# Patient Record
Sex: Male | Born: 1940 | Race: White | Hispanic: No | Marital: Married | Smoking: Former smoker
Health system: Southern US, Community
[De-identification: ages and names within clinical notes are randomized; demographics above are authoritative.]

## PROBLEM LIST (undated history)

## (undated) DIAGNOSIS — H3530 Unspecified macular degeneration: Secondary | ICD-10-CM

## (undated) DIAGNOSIS — I8390 Asymptomatic varicose veins of unspecified lower extremity: Secondary | ICD-10-CM

## (undated) HISTORY — PX: ENDOVENOUS ABLATION SAPHENOUS VEIN W/ LASER: SUR449

## (undated) HISTORY — PX: CHOLECYSTECTOMY: SHX55

## (undated) HISTORY — DX: Asymptomatic varicose veins of unspecified lower extremity: I83.90

## (undated) HISTORY — DX: Unspecified macular degeneration: H35.30

## (undated) HISTORY — PX: HEMORRHOID SURGERY: SHX153

---

## 2001-03-19 ENCOUNTER — Ambulatory Visit (HOSPITAL_COMMUNITY): Admission: RE | Admit: 2001-03-19 | Discharge: 2001-03-19 | Payer: Self-pay | Admitting: Internal Medicine

## 2001-03-19 ENCOUNTER — Encounter: Payer: Self-pay | Admitting: Internal Medicine

## 2002-02-18 ENCOUNTER — Ambulatory Visit (HOSPITAL_COMMUNITY): Admission: RE | Admit: 2002-02-18 | Discharge: 2002-02-18 | Payer: Self-pay | Admitting: Internal Medicine

## 2002-03-20 ENCOUNTER — Encounter: Payer: Self-pay | Admitting: Internal Medicine

## 2002-03-20 ENCOUNTER — Ambulatory Visit (HOSPITAL_COMMUNITY): Admission: RE | Admit: 2002-03-20 | Discharge: 2002-03-20 | Payer: Self-pay | Admitting: Internal Medicine

## 2003-03-31 ENCOUNTER — Ambulatory Visit (HOSPITAL_COMMUNITY): Admission: RE | Admit: 2003-03-31 | Discharge: 2003-03-31 | Payer: Self-pay | Admitting: Internal Medicine

## 2003-10-05 ENCOUNTER — Ambulatory Visit (HOSPITAL_COMMUNITY): Admission: RE | Admit: 2003-10-05 | Discharge: 2003-10-05 | Payer: Self-pay | Admitting: Urology

## 2003-10-22 ENCOUNTER — Ambulatory Visit (HOSPITAL_COMMUNITY): Admission: RE | Admit: 2003-10-22 | Discharge: 2003-10-22 | Payer: Self-pay | Admitting: Internal Medicine

## 2003-11-09 ENCOUNTER — Ambulatory Visit (HOSPITAL_COMMUNITY): Admission: RE | Admit: 2003-11-09 | Discharge: 2003-11-10 | Payer: Self-pay | Admitting: Neurosurgery

## 2004-03-22 ENCOUNTER — Ambulatory Visit (HOSPITAL_COMMUNITY): Admission: RE | Admit: 2004-03-22 | Discharge: 2004-03-22 | Payer: Self-pay | Admitting: Internal Medicine

## 2004-03-25 ENCOUNTER — Ambulatory Visit (HOSPITAL_COMMUNITY): Admission: RE | Admit: 2004-03-25 | Discharge: 2004-03-25 | Payer: Self-pay | Admitting: Internal Medicine

## 2005-03-27 ENCOUNTER — Ambulatory Visit (HOSPITAL_COMMUNITY): Admission: RE | Admit: 2005-03-27 | Discharge: 2005-03-27 | Payer: Self-pay | Admitting: Internal Medicine

## 2006-04-05 ENCOUNTER — Ambulatory Visit (HOSPITAL_COMMUNITY): Admission: RE | Admit: 2006-04-05 | Discharge: 2006-04-05 | Payer: Self-pay | Admitting: Internal Medicine

## 2006-09-18 ENCOUNTER — Emergency Department (HOSPITAL_COMMUNITY): Admission: EM | Admit: 2006-09-18 | Discharge: 2006-09-18 | Payer: Self-pay | Admitting: Emergency Medicine

## 2007-04-09 ENCOUNTER — Ambulatory Visit (HOSPITAL_COMMUNITY): Admission: RE | Admit: 2007-04-09 | Discharge: 2007-04-09 | Payer: Self-pay | Admitting: Internal Medicine

## 2007-04-16 ENCOUNTER — Ambulatory Visit: Payer: Self-pay | Admitting: Vascular Surgery

## 2007-07-16 ENCOUNTER — Ambulatory Visit: Payer: Self-pay | Admitting: Vascular Surgery

## 2007-08-12 ENCOUNTER — Ambulatory Visit: Payer: Self-pay | Admitting: Vascular Surgery

## 2007-08-20 ENCOUNTER — Ambulatory Visit: Payer: Self-pay | Admitting: Vascular Surgery

## 2007-10-14 ENCOUNTER — Ambulatory Visit: Payer: Self-pay | Admitting: Vascular Surgery

## 2007-10-22 ENCOUNTER — Ambulatory Visit: Payer: Self-pay | Admitting: Vascular Surgery

## 2008-05-14 ENCOUNTER — Encounter (INDEPENDENT_AMBULATORY_CARE_PROVIDER_SITE_OTHER): Payer: Self-pay | Admitting: *Deleted

## 2008-06-04 DIAGNOSIS — K644 Residual hemorrhoidal skin tags: Secondary | ICD-10-CM | POA: Insufficient documentation

## 2008-06-04 DIAGNOSIS — Z8719 Personal history of other diseases of the digestive system: Secondary | ICD-10-CM | POA: Insufficient documentation

## 2008-06-05 ENCOUNTER — Ambulatory Visit: Payer: Self-pay | Admitting: Internal Medicine

## 2008-06-05 DIAGNOSIS — K5730 Diverticulosis of large intestine without perforation or abscess without bleeding: Secondary | ICD-10-CM | POA: Insufficient documentation

## 2008-06-09 ENCOUNTER — Telehealth (INDEPENDENT_AMBULATORY_CARE_PROVIDER_SITE_OTHER): Payer: Self-pay

## 2008-06-19 ENCOUNTER — Ambulatory Visit (HOSPITAL_COMMUNITY): Admission: RE | Admit: 2008-06-19 | Discharge: 2008-06-19 | Payer: Self-pay | Admitting: Internal Medicine

## 2008-06-19 ENCOUNTER — Ambulatory Visit: Payer: Self-pay | Admitting: Internal Medicine

## 2008-06-22 ENCOUNTER — Encounter: Payer: Self-pay | Admitting: Internal Medicine

## 2008-08-25 ENCOUNTER — Ambulatory Visit: Payer: Self-pay | Admitting: Vascular Surgery

## 2009-01-11 ENCOUNTER — Ambulatory Visit: Payer: Self-pay | Admitting: Vascular Surgery

## 2009-01-25 ENCOUNTER — Ambulatory Visit: Payer: Self-pay | Admitting: Vascular Surgery

## 2009-02-02 ENCOUNTER — Ambulatory Visit: Payer: Self-pay | Admitting: Vascular Surgery

## 2009-09-14 ENCOUNTER — Ambulatory Visit: Payer: Self-pay | Admitting: Vascular Surgery

## 2009-11-04 ENCOUNTER — Ambulatory Visit: Payer: Self-pay | Admitting: Otolaryngology

## 2009-11-18 ENCOUNTER — Ambulatory Visit: Payer: Self-pay | Admitting: Otolaryngology

## 2010-07-12 NOTE — Procedures (Signed)
LOWER EXTREMITY VENOUS REFLUX EXAM   INDICATION:  Pain/swelling of left leg.   EXAM:  Using color-flow imaging and pulse Doppler spectral analysis, the  left common femoral, superficial femoral, popliteal, posterior tibial,  greater and lesser saphenous veins are evaluated.  There is no evidence  suggesting deep venous insufficiency in the left lower extremity.   The left saphenofemoral junction is competent. The left GSV is not  competent with reflux of >532milliseconds with the caliber as described  below.  The left GSV appears thrombosed from mid thigh to knee level.   The left proximal short saphenous vein demonstrates incompetency.  The  left lesser saphenous vein appears thrombosed mid calf to distal calf.   GSV Diameter (used if found to be incompetent only)                                            Right    Left  Proximal Greater Saphenous Vein           cm       0.42 cm  Proximal-to-mid-thigh                     cm       0.41 cm  Mid thigh                                 cm       0.36 cm  Mid-distal thigh                          cm       cm  Distal thigh                              cm       0.36 cm  Knee                                      cm       0.28 cm   IMPRESSION:  1. 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  2. The left greater saphenous vein is not aneurysmal.  3. The left greater saphenous vein is not tortuous.  4. The deep venous system is competent.  5. The left lesser saphenous vein is not competent with reflux of      >530milliseconds.      The left LSV appears thrombosed mid calf to distal calf.      ___________________________________________  Quita Skye. Hart Rochester, M.D.   CB/MEDQ  D:  09/14/2009  T:  09/14/2009  Job:  [id number]

## 2010-07-12 NOTE — Assessment & Plan Note (Signed)
OFFICE VISIT   Steve Hawkins, Steve Hawkins  DOB:  January 09, 1941                                       09/14/2009  ZOXWR#:[id number]   The patient returns today for a recent problem he had associated with  his left leg.  He has previously undergone laser ablation of the left  great saphenous vein in August of 2009 and left laser ablation of the  left small saphenous vein in November of 2010, both associated with stab  phlebectomies.  Following his great saphenous procedure a duplex  revealed that he did have a bifid parallel system and the more lateral  branch of the great saphenous vein was totally thrombosed but there was  a medial branch which did not have severe reflux at that time.  On July  4 he developed a swollen area on the medial aspect of his distal thigh  on the left with some bluish discoloration and tenderness and over the  last week or so that has resolved without treatment.  He has had no  change in his distal edema or other areas.   On examination today he has a small area of reticular veins in the  medial aspect of the left lower thigh.  There are  no bulging  varicosities.  On the anterior aspect there are a few small varicosities  in the mid calf posteriorly.  He has palpable pulses distally with no  distal edema.  Venous duplex exam was ordered by me today which was  reviewed and interpreted.  He does have a deep system which is normal  with no DVT.  The lateral branch of the great saphenous vein is ablated.  The more medial branch does have some flow and a few areas with some  mild reflux.   I do not think any treatment is necessary at this time.  If he should  develop increasing bulging varicosities or other thrombosed varicosities  with thrombophlebitis he should return and we will repeat a venous  duplex exam and it could be that he will require closure of the more  medial branch of his great saphenous vein in the future if this worsens.  Blood  pressure 130/80, heart rate 55, respirations 20.  He will return  to see [place] on a p.r.n. basis.     Quita Skye Hart Rochester, M.D.  Electronically Signed   JDL/MEDQ  D:  09/14/2009  T:  09/15/2009  Job:  [id number]

## 2010-07-12 NOTE — Assessment & Plan Note (Signed)
OFFICE VISIT   Steve Hawkins, Steve Hawkins  DOB:  November 22, 1940                                       02/02/2009  ZOXWR#:[id number]   The patient returns 1 week post laser ablation of the left small  saphenous vein with multiple stab phlebectomies for painful  varicosities.  He states that the edema in his left ankle and foot has  already diminished significantly and his left leg already feels better.  He has some mild aching over the course of the small saphenous vein  where the laser ablation was performed.   Venous duplex exam today reveals no evidence of deep venous obstruction  with closure of the left small saphenous vein from the saphenopopliteal  junction to the distal calf.   He was reassured regarding these findings.  Blood pressure today is  143/81, heart rate 61, respirations 20.  He will wear elastic  compression stockings for 1 more week and then on a p.r.n. basis and  return to see [place] as necessary.   Quita Skye Hart Rochester, M.D.  Electronically Signed   JDL/MEDQ  D:  02/02/2009  T:  02/03/2009  Job:  [id number]

## 2010-07-12 NOTE — Consult Note (Signed)
VASCULAR SURGERY CONSULTATION   NASHUA, HOMEWOOD  DOB:  [birth year]                                       04/16/2007  ZOXWR#:[id number]   Patient is a 70 year old healthy male patient with a 10-year history of  progressive venous insufficiency on both lower extremities.  He has been  having progressive edema in the lower calf and ankle as the day  progresses, which is associated with aching, throbbing, and sometimes  burning discomfort in the calf and ankle area bilaterally.  As the  swelling progresses, his symptoms worsen.  He has no history of deep  venous thrombosis, thrombophlebitis, or pulmonary emboli, but states  that he has had some episodes where the veins have become red and  tender.  He has had no bleeding, ulceration, or other complications.  He states that these symptoms are effecting his daily living and are  fairly similar between the right and left leg.  He has worn short leg  elastic compression stockings with some mild help, although these were  not graduated compression stocking.  He does not elevate his legs on a  frequent basis nor take pain medications.   PAST MEDICAL HISTORY:  Negative for diabetes, hypertension, coronary  artery disease, COPD, hyperlipidemia, or stroke.   PAST SURGICAL HISTORY:  1. Cholecystectomy.  2. Hemorrhoidectomy.  3. Lumbar disk surgery.   FAMILY HISTORY:  Negative for coronary artery disease, diabetes, and  stroke.  Positive for severe varicose veins in his father.   SOCIAL HISTORY:  He is married, has three children, is retired.  He has  not smoked since 2001.  Drinks one alcoholic beverage a day.   REVIEW OF SYSTEMS:  Unremarkable.   MEDICATIONS:  Include Avodart 1 daily.   ALLERGIES:  None known.   PHYSICAL EXAMINATION:  Blood pressure 118/72, heart rate 80,  respirations 14.  Generally, he is a healthy-appearing middle-aged male  in no apparent distress.  He is alert and oriented x3.  Neck is supple  with 3+ carotid pulses palpable.  No bruits are audible.  Neurologic  exam is normal.  No palpable adenopathy in the neck.  No skin rashes are  noted.  Upper extremity pulses are 3+ bilaterally.  Chest:  Clear to  auscultation.  Cardiovascular:  Regular rhythm with no murmurs.  Abdomen  is soft and nontender with no palpable masses.  Extremity exam reveals  3+ femoral, popliteal, and 2+ dorsalis pedis pulses palpable  bilaterally.  Both legs have significant venous insufficiency.  On the  left leg, there are large varicosities in the distal medial thigh and  medial calf region and a large nest of posterior calf varicosities.  There are prominent spider and reticular veins near the ankle with some  early hyperpigmentation.  On the right side, there are greater saphenous  varicosities beginning at the knee and extending into the calf region  medially and posteriorly in the calf.  There are also some dilated veins  around the ankle with some early hyperpigmentation but no ulceration.   Duplex scanning was performed in the office today, which reveals three  significant findings:  He has reflux in both greater saphenous veins  throughout the thigh and also has reflux in the left small saphenous  vein.  There is no evidence of deep venous thrombosis or deep venous  problems.  I think we should treat him with elastic compression stockings,  analgesics, elevation, and conservative measures to see if he gets  improvement in his symptoms.  If not, he would benefit from staged  bilateral greater saphenous laser ablations with stab phlebectomies, to  be followed by the left small saphenous vein, treated in a similar  fashion.  Patient will return in three months for followup.   Quita Skye Hart Rochester, M.D.  Electronically Signed  JDL/MEDQ  D:  04/16/2007  T:  04/17/2007  Job:  [id number]   cc:   Kingsley Callander. Ouida Sills, MD

## 2010-07-12 NOTE — Op Note (Signed)
NAME:  Steve Hawkins, Steve Hawkins NO.:  [id number]   MEDICAL RECORD NO.:  [id number]          PATIENT TYPE:  AMB   LOCATION:  DAY                           FACILITY:  [facility]   PHYSICIAN:  R. Roetta Sessions, M.D. DATE OF BIRTH:  1940/05/01   DATE OF PROCEDURE:  06/19/2008  DATE OF DISCHARGE:                               OPERATIVE REPORT   PROCEDURE:  Diagnostic colonoscopy.   INDICATIONS FOR PROCEDURE:  A 70 year old gentleman with intermittent  blood per rectum in a setting of otherwise normal bowel function.  He  has 1-2 bowel movements daily without straining.  Denies constipation,  diarrhea.  Has not had any melena.  Last colonoscopy was 10 years ago.  No family history of colon cancer in any first degree relatives.  Colonoscopy is now being done.  Risks, benefits, alternatives and  limitations have been reviewed.  Questions answered.  Please see the  documentation in the medical record.   PROCEDURE:  O2 saturation, blood pressure, pulse, respirations were  monitored throughout the entire procedure.  Conscious sedation Versed 4  mg IV, Demerol 75 mg IV in divided doses.  Instrument Pentax video chip  system.  Digital exam revealed prominent circumferential prolapsed hemorrhoidal  tags.  On digital exam there are no rectal masses.  However, one of  these tags was excoriated and readily bled with digital manipulation.  Please see detailed photograph that was taken on the Pentax system.   ENDOSCOPIC FINDINGS:  The prep was good.  Examination of the rectal  mucosa revealed retroflexed view of the anal verge which demonstrated no  abnormalities.   COLON:  Colonic mucosa was surveyed from the rectosigmoid junction  through the left transverse, right colon, appendiceal orifice, ileocecal  valve and cecum.  These structures were well seen and photographed for  the record.  From this level the scope was slowly and cautiously  withdrawn.  All previous mentioned mucosal surfaces  were again seen.  The patient had scattered pancolonic diverticula.  The remainder of the  colonic mucosa appeared normal.  The scope was pulled back down in the  rectum and rectal mucosa was once again examined including retroflexion  without additional findings.  The patient tolerated the procedure well  and was reactive after endoscopy.  Cecal withdrawal time 10 minutes.   IMPRESSION:  1. Multiple circumferential external hemorrhoid tags, one excoriated      and was friable and easily bled.  2. Normal rectum with few scattered pancolonic diverticulum.      Remainder of colonic mucosa appeared normal.   RECOMMENDATIONS:  1. I feel that [name] needs to go ahead and have definitive      surgical management of bleeding external hemorrhoids, conservative      measures unlikely to make any difference over the short or long      haul in this setting.  I have recommended [name] see a surgeon      for definitive management.  Diverticulosis literature provided      to [name].  He does take Benefiber one tablespoon daily and I  have recommended he continue that approach.  2. Consider repeat screening colonoscopy 10 years.      [name], M.D.  Electronically Signed     RMR/MEDQ  D:  06/19/2008  T:  06/19/2008  Job:  [id number]   cc:   Kingsley Callander. Ouida Sills, MD  Fax: [phone]

## 2010-07-12 NOTE — Assessment & Plan Note (Signed)
OFFICE VISIT   Steve Hawkins, Steve Hawkins  DOB:  08-06-1940                                       10/22/2007  ZOXWR#:[id number]   The patient returns today 1 week post laser ablation of left great  saphenous vein with multiple stab phlebectomies for painful  varicosities.  He has done well from a clinical standpoint with no  unusual discomfort in the left thigh compared to the contralateral right  side which was done previously.  He has had some left posterior calf  discomfort.  There has been no change in his distal edema.   EXAM:  Today blood pressure 118/70, heart rate 70, respirations 14.  Left leg has mild tenderness along the course of the great saphenous  vein with no swelling.  He also has a palpable cord along the course of  the small saphenous vein in the posterior calf.  There is no distal  edema.  The stab phlebectomy wounds are all healing satisfactorily.   Venous duplex exam reveals total occlusion of the primary trunk of the  left great saphenous vein, although there is a parallel system and the  medial most trunk is patent but has minimal flow and no reflux.  Great  saphenous vein communicates with the lesser saphenous vein at the level  of the knee and interestingly, the left lesser saphenous vein is totally  occluded now having had gross reflux on our previous study.  There is no  deep venous thrombosis either at the popliteal level or at the common  femoral level, the deep system being widely patent.   I think he thrombosed the left small saphenous vein secondary to laser  ablation procedure because of the communication between the small and  great saphenous systems.  The vein of Giacomini.  I recommended that he  take 1 aspirin per day indefinitely as well as continue to take  ibuprofen for 2 more weeks for anti-inflammatories effect.  He will  continue to wear his elastic compression stockings for the next few  weeks or on any plane trips  within the next few months.  If he has any  change in the distal edema, he will be in touch with me.  Otherwise we  will see him on a p.r.n. basis.   Quita Skye Hart Rochester, M.D.  Electronically Signed   JDL/MEDQ  D:  10/22/2007  T:  10/23/2007  Job:  [id number]

## 2010-07-12 NOTE — Assessment & Plan Note (Signed)
OFFICE VISIT   CHANCELLOR, VANDERLOOP  DOB:  November 16, 1940                                       08/20/2007  JYNWG#:[id number]   The patient underwent laser ablation of right greater saphenous vein  with multiple stab phlebectomies 1 week ago for severe venous  insufficiency of right leg.  He has had minimal discomfort and swelling  and is wearing his elastic compression stocking as requested.  He did  take the ibuprofen for 2 days.  He has had some mild bruising in the  stab phlebectomy sites between the knee and the ankle but this seems to  be resolving he states.  Venous duplex exam was performed in the  vascular lab today which reveals total occlusion of the right great  saphenous vein from the saphenofemoral junction to the knee with no  evidence of deep venous thrombosis.  We will proceed with laser ablation  of his left great saphenous vein with multiple stab phlebectomies on  August 17.   James D. Hart Rochester, M.D.  Electronically Signed   JDL/MEDQ  D:  08/20/2007  T:  08/21/2007  Job:  [id number]

## 2010-07-12 NOTE — Assessment & Plan Note (Signed)
OFFICE VISIT   Steve Hawkins, Steve Hawkins  DOB:  Jul 13, 1940                                       08/25/2008  JXBJY#:[id number]   The patient returns today status post laser ablation of left great  saphenous vein with multiple stab phlebectomies in August of 2009 for  painful varicosities in the left thigh and calf.  He also had gross  reflux in the left small saphenous vein which had been approved for  laser ablation and multiple stab phlebectomies.  He returned after his  initial visit following the greater saphenous ablation.  It appeared  that there was some possible acute closure of the small saphenous vein  in areas although it was still dilated.  We elected not to perform that  at that time but to follow him.  He has done well for several months but  over the last 3-4 months has been having some new varicosities in the  distal thigh and calf and continues to have aching throbbing discomfort  in the left calf.  He has not had any stasis ulcers or bleeding but has  had persistent edema in the left ankle compared to the right side.  He  states that the discomfort has returned and he is having aching,  throbbing and burning discomfort in the calf and distal thigh.   PHYSICAL EXAMINATION:  On exam today his blood pressure is 118/78, heart  rate is 56, respirations 14.  His left leg has new varicosities in the  distal thigh over the course of the distal great saphenous vein and he  continues to have bulging varicosities in the left posterior calf in the  distribution of the small saphenous vein.  He has 1-2+ edema at the  ankle.   Venous duplex exam was repeated today.  He has a normal deep system on  the left side but has a large small saphenous vein with gross reflux  which communicates with the great saphenous vein in the calf.   I feel most likely that the gross reflux in the large small saphenous  vein is causing his persistent ankle edema and painful  varicosities in  the left calf as well as these new varicosities in the distal greater  saphenous system anteriorly because of the communication.  We will once  again put him in long leg elastic compression stockings (20 mm - 30 mm  gradient) as well as try elevation and analgesics (ibuprofen daily) to  see if this will relieve his symptoms.  He will return in 3-4 months for  further followup to see how his symptoms are progressing.  If this is  not resolved I think he would benefit from laser ablation of the left  small saphenous vein with multiple stab phlebectomies to relieve his  painful varicosities and also to relieve the edema in the left ankle.   Quita Skye Hart Rochester, M.D.  Electronically Signed   JDL/MEDQ  D:  08/25/2008  T:  08/26/2008  Job:  [id number]

## 2010-07-12 NOTE — Procedures (Signed)
DUPLEX DEEP VENOUS EXAM - LOWER EXTREMITY   INDICATION:  Follow-up evaluation of right lower extremity, status post  GSV ablation.   HISTORY:  Edema:  No.  Trauma/Surgery:  Right GSV ablation and sclerotherapy.  Pain:  No.  PE:  No.  Previous DVT:  No.  Anticoagulants:  No.  Other:   DUPLEX EXAM:                CFV   SFV   PopV  PTV    GSV                R  L  R  L  R  L  R   L  R  L  Thrombosis    o     o     o     o      +  Spontaneous   +     +     +     +      0  Phasic        +     +     +     +      0  Augmentation  +     +     +     +      0  Compressible  +     +     +     +      0  Competent   Legend:  + - yes  o - no  p - partial  D - decreased   IMPRESSION:  1. No evidence of deep venous thrombosis in right lower extremity.  2. Right greater saphenous vein appears occluded from the      saphenofemoral junction distally, status post ablation.    _____________________________  Quita Skye Hart Rochester, M.D.   PB/MEDQ  D:  08/20/2007  T:  08/20/2007  Job:  [id number]

## 2010-07-12 NOTE — Assessment & Plan Note (Signed)
OFFICE VISIT   MEDFORD, STAHELI  DOB:  1940-06-01                                       07/16/2007  EAVWU#:[id number]   The patient returns today for further evaluation of his severe venous  insufficiency of both lower extremities.  He continues to have aching  and throbbing discomfort in both legs, thighs and calf, with the right  being worse than the left.  He has been wearing elastic compression  stockings for the last 3 months with no improvement of his symptoms.  He  has also tried analgesics on a regular basis and elevation of the legs  with no relief of his symptomatology.  He does have more swelling in the  right ankle toward the end of the day than the left side, but both legs  are symptomatic and affecting his daily living.  He does have gross  incompetence of both greater saphenous systems as well as the left small  saphenous vein.  His varicosities are in the right medial calf and the  left medial thigh and calf and posterior calf.  I think he would be best  treated with laser ablation of the right great saphenous vein with some  calf stab phlebectomies simultaneously as his first procedure to be  followed by laser ablation of the left great saphenous vein with stab  phlebectomies, to be followed by laser ablation of the left small  saphenous vein with stab phlebectomies.  We will proceed with  precertification for these procedures to be done in the near future.  I  did visualize his saphenous veins today with the duplex scanner.  He  also has some early skin changes in both ankles on physical exam, mild  edema on the right side, and palpable dorsalis pedis and posterior  tibial pulses bilaterally.   Quita Skye Hart Rochester, M.D.  Electronically Signed   JDL/MEDQ  D:  07/16/2007  T:  07/17/2007  Job:  [id number]

## 2010-07-12 NOTE — Assessment & Plan Note (Signed)
OFFICE VISIT   SEDERICK, JACOBSEN  DOB:  1940/03/24                                       01/11/2009  OZHYQ#:[id number]   The patient returns today for further examination of his painful  varicosities in the left calf which recurred.  He had successful  ablation of the left great saphenous vein in August 2009 and at that  time was noted to have reflux in the small saphenous vein, but that  thrombosed spontaneously.  Unfortunately that recanalized and he has  developed severe painful varicosities in the left posterior calf  associated with his small saphenous reflux.  This has not been relieved  by long-leg elastic compression stockings for the past 3 months.  He has  also tried elevation and analgesics (ibuprofen).  He has aching, burning  and throbbing discomfort as well as swelling in the left ankle as the  day progresses.   He denies any chest pain, dyspnea on exertion, PND, orthopnea, chronic  bronchitis, COPD and no abdominal symptoms.  All other systems are  negative.   SOCIAL HISTORY:  He is married, has three children, is retired and does  not use tobacco since 2004.   PHYSICAL EXAM:  Blood pressure 120/70, heart rate 70, respirations 14,  carotid pulses 3+, no audible bruits.  Neurologic:  Normal.  No palpable  adenopathy in the neck.  Chest:  Clear to auscultation.  Cardiovascular:  Regular rhythm, no murmurs.  Lower extremity exam:  Reveals no skin  rashes.  He has bulging varicosities in left posterior calf extending  down to the ankle with 1+ edema in the left ankle compared to the right.   He has no evidence of deep venous obstruction and does have palpable  arterial pulses in his left foot and gross reflux in the left small  saphenous vein.  I think we should proceed with laser ablation of left  small saphenous vein with multiple stab phlebectomies to relieve his  symptoms.  We will proceed with precertification to perform this in the  near future.   Quita Skye Hart Rochester, M.D.  Electronically Signed   JDL/MEDQ  D:  01/11/2009  T:  01/12/2009  Job:  [id number]

## 2010-07-12 NOTE — Procedures (Signed)
DUPLEX DEEP VENOUS EXAM - LOWER EXTREMITY   INDICATION:  Followup, left greater saphenous vein ablation.   HISTORY:  Edema:  No.  Trauma/Surgery:  Left greater saphenous vein ablation with stab  phlebectomy, 10/14/07, by Dr. Hart Rochester.  Right greater saphenous vein  ablation with stab phlebectomy on 08/12/07 by Dr. Hart Rochester.  Pain:  Posterior of left calf.  PE:  No.  Previous DVT:  No.  Anticoagulants:  No.  Other:   DUPLEX EXAM:                CFV   SFV   PopV   PTV   GSV                R  L  R  L  R  L   R  L  R  L  Thrombosis    o  o     o     o      O     +  Spontaneous   +  +     +     +      +     0  Phasic        +  +     +     +      +     0  Augmentation  +  +     +     +      +     0  Compressible  +  +     +     +      +     0  Competent     +  D     +     +      +     0   Legend:  + - yes  o - no  p - partial  D - decreased   IMPRESSION:  1. No evidence of deep venous thrombosis in the left lower extremity      or right common femoral vein.  2. 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  3. Left short saphenous vein shows evidence of acute thrombus and      dilatation with a branch connecting to the greater saphenous vein.       _____________________________  Quita Skye. Hart Rochester, M.D.   AS/MEDQ  D:  10/22/2007  T:  10/22/2007  Job:  [id number]

## 2010-07-12 NOTE — Procedures (Signed)
LOWER EXTREMITY VENOUS REFLUX EXAM   INDICATION:  Left leg varicose vein with pain.   EXAM:  Using color-flow imaging and pulse Doppler spectral analysis, the  left common femoral, superficial femoral, popliteal, posterior tibial,  greater and lesser saphenous veins are evaluated.  There is no evidence  suggesting deep venous insufficiency in the left lower extremity.   The left saphenofemoral junction is competent.  The left GSV, below knee  level, is not competent with the caliber as described below.   The left proximal short saphenous vein demonstrates incompetency.   GSV Diameter (used if found to be incompetent only)                                            Right    Left  Proximal Greater Saphenous Vein           cm       cm  Proximal-to-mid-thigh                     cm       cm  Mid thigh                                 cm       cm  Mid-distal thigh                          cm       cm  Distal thigh                              cm       cm  Knee                                      cm       cm   IMPRESSION:  1. Left greater saphenous vein, below knee level, reflux is identified      with the caliber ranging from 0.45.  2. The left greater saphenous vein is not aneurysmal.  3. The left greater saphenous vein is not tortuous.  4. The deep venous system is competent.  5. The left lesser saphenous vein is not competent.  6. No evidence of deep venous thrombosis noted in the left leg.  7. Please see attached measurements for the lesser saphenous vein.       ___________________________________________  Quita Skye. Hart Rochester, M.D.   MG/MEDQ  D:  08/25/2008  T:  08/25/2008  Job:  [id number]

## 2010-07-12 NOTE — Procedures (Signed)
DUPLEX DEEP VENOUS EXAM - LOWER EXTREMITY   INDICATION:  Followup of a left small saphenous vein ablation.   HISTORY:  Edema:  Yes  Trauma/Surgery:  EVLA  Pain:  Yes  PE:  No  Previous DVT:  No  Anticoagulants:  No  Other:   DUPLEX EXAM:                CFV   SFV   PopV  PTV    GSV                R  L  R  L  R  L  R   L  R  L  Thrombosis    o  o     o     o      o     o  Spontaneous   +  +     +     +      +     +  Phasic        +  +     +     +      +     +  Augmentation  +  +     +     +      +     +  Compressible  +  +     +     +      +     +  Competent     +  +     +     +      +     +   Legend:  + - yes  o - no  p - partial  D - decreased   IMPRESSION:  No evidence of deep venous thrombosis identified.  The left  small saphenous vein is ablated from the saphenopopliteal junction to  the distal calf.    _____________________________  Quita Skye. Hart Rochester, M.D.   CJ/MEDQ  D:  02/02/2009  T:  02/02/2009  Job:  [id number]

## 2010-07-15 NOTE — Op Note (Signed)
NAME:  Steve Hawkins, Steve Hawkins NO.:  [id number]   MEDICAL RECORD NO.:  [id number]                   PATIENT TYPE:  OIB   LOCATION:  2890                                 FACILITY:  [facility]   PHYSICIAN:  Clydene Fake, M.D.               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   PROCEDURE:  Left L2-3 far lateral discectomy, microdissection with  microscope.   SURGEON:  Clydene Fake, M.D.   ASSISTANT:  Payton Doughty, M.D.   ANESTHESIA:  General endotracheal anesthesia.   ESTIMATED BLOOD LOSS:  Minimal.   BLOOD REPLACED:  None.   DRAINS:  None.   COMPLICATIONS:  None.   INDICATIONS FOR PROCEDURE:  The patient is a 70 year old gentleman who has  been having back and left leg and groin pain.  MRI was done and found a  large foraminal disc herniation left L2-3 compressing L2 root.  The patient  is brought in for decompression.   DESCRIPTION OF PROCEDURE:  The patient was brought to the operating room.  General anesthesia induced.  The patient was placed in a Wilson frame with  all pressure points padded.  The patient was prepped and draped in sterile  fashion.  Site of incision was injected with 19 mL 1% lidocaine with  epinephrine.  Needle was placed in the interspace. X-rays were obtained  showing we were pointing at the 2-3 interspace.  Incision was then made  centered over that needle just slightly lateral to the midline to the left.  Incision was taken down to the fascia.  Hemostasis obtained.  Bovie  positioned.  Fascia was incised.  Paraspinous muscle was split and we found  two facets placed markers around the center, taking x-ray showing this was 3-  4 level.  We extend the incision just 0.5 cm or so extended her  dissection  cephalad from the transverse process of L2 and 3 and put a self-retaining  retractor so we could see the facets and pars.  L2 and the __________  processes of both 2 and 3.  Marker was placed in this foramen.  Other x-rays  were obtained showing we were at the 2-3 interspace.  __________ was then  used to start removing lateral facet and pars completed with Kerrison  punches.  Microscope was brought in for microdissection at this point.  We  dissected carefully in the foramen and found some ligamentum flavum, removed  that, decompressing the foramen.  We followed the three pedicle, vertebral  body and then moved cephalad finding the disc space.  We dissected down to  the disc space giving hemostasis with bipolar cauterization and Gelfoam and  thrombin.  Large  disc bulge was seen.  At this point, no free fragments were  seen but the nerve root did seem to be tight which was cephalad and lateral  to where we were. Disc space was incised with #15 blade and discectomy  performed with pituitary rongeurs and curettes.  We started using a hook and  coming up into the nerve root to make sure it was decompressed.  There we  found further fragments of disc and we carefully removed them and some of  these were scarred in around the nerve and its veins.  We carefully  dissected this area out and we were able to remove the free fragments.  When  we were finished, we had good decompression of the two roots through its  course from the foramen out laterally.  Hemostasis obtained with Gelfoam and  Thrombin.  Wound was irrigated with antibiotic solution.  Depo-Medrol was  placed over the two roots and retractor was removed.  Fascia closed with 0  Vicryl interrupted suture.  The subcutaneous tissue was closed with 0, 2-0  and 3-0 Vicryl interrupted sutures and the skin closed with Benzoin and  Steri-Strips. Dressing was placed.  The patient was placed back in supine  position, awakened from  anesthesia and returned to the recovery room in  stable condition.                                               Clydene Fake, M.D.    JRH/MEDQ  D:  11/09/2003  T:  11/09/2003  Job:  [id number]

## 2010-07-15 NOTE — Op Note (Signed)
NAME:  Steve Hawkins, Steve Hawkins NO.:  [id number]   MEDICAL RECORD NO.:  [id number]                   PATIENT TYPE:  AMB   LOCATION:  DAY                                  FACILITY:  [facility]   PHYSICIAN:  R. Roetta Sessions, M.D.              DATE OF BIRTH:  07-Nov-1940   DATE OF PROCEDURE:  02/18/2002  DATE OF DISCHARGE:                                 OPERATIVE REPORT   PROCEDURE:  Sigmoidoscopy with biopsy.   INDICATION FOR PROCEDURE:  The patient is a 70 year old gentleman who has  had small-volume rectal bleeding chronically for years but over this past  weekend had a much larger amount of painless fresh blood and clot per  rectum.  He tells me that he noted the seat of his pants was wet and went to  the bathroom and noted gross blood and clot in his underwear, even staining  his pants.  He has had multiple episodes.  He has not had any change in his  bowel habits.  He tells me he has one formed bowel movement daily.  No  abdominal pain, no lightheadedness.  He contacted me that evening and made  plans to evaluate him further today.   He tells me the larger-volume clots and blood per rectum have ceased, but he  continues to pass a small amount per rectum and this is not associated with  BMs.   The patient again gives a history of rectal bleeding.  He underwent a  sigmoidoscopy for hematochezia in 2000, was found to have some small polyps.  He subsequently underwent colonoscopy.  He had some inflammatory polyps that  were cold biopsied and coagulated from the left colon.  Nothing else was  found apparently.  Back in 1995 he was found to have some chronic colitis  on biopsies at colonoscopy.  He tells me has colitis symptoms from time to  time, passing mucous-like BMS, and a family history of colorectal neoplasia.   Sigmoidoscopy is now being done to further evaluate his symptoms.  He told  me that had hemorrhoid surgery some 35 years ago but has continued to  have  some, again, small-volume rectal bleeding ever since.  Sigmoidoscopy is now  being done to further evaluate his symptoms.  The approach has been  discussed with the patient.  The potential risks, benefits, and alternatives  have been reviewed.  ASA-1.   DESCRIPTION OF PROCEDURE:  The patient was placed in the left lateral  decubitus position.  O2 saturation, blood pressure, pulse, and respirations  were monitored throughout the entire procedure.  No conscious sedation was  given.   Instrument:  Olympus video chip adult colonoscope.   FINDINGS:  Inspection of the anus revealed four external hemorrhoidal tags.  They were soft.  They were not thrombosed and nontender.  Digital exam  revealed no abnormalities.   Endoscopic findings:  The prep was good.  Rectum:  Examination of the rectal mucosa, including retroflexed view of the  anal verge, again revealed the external/anal canal hemorrhoids.  The  vascular pattern of the rectal mucosa was preserved.  There were no erosions  or ulcerations.  There was slight granularity of the mucosa with a couple of  excoriated areas most consistent with enema tip trauma.   Colon:  The colonic mucosa was surveyed, surveyed to 40 cm.  The scope was  advanced to 40 cm in 1:1 fashion.  There were a couple of 3 mm polyps and  some scattered diverticula, and the colonic mucosa otherwise appeared normal  to 40 cm.  From this level the scope was slowly withdrawn.  All previously-  mentioned mucosal surfaces were again seen.  The two small polyps were cold  biopsied.  I also elected to take two biopsies of the rectal mucosa.   The patient tolerated the procedure well and was discharged.   IMPRESSION:  1. Multiple external hemorrhoids.  2. Slight granularity of the rectal mucosa of doubtful clinical     significance.  3. Scattered left-sided diverticula, two polyps in the sigmoid colon, cold     biopsied; the remainder of colonic mucosa to 40 cm  appeared normal.   I suspect the patient has bled from hemorrhoids.   RECOMMENDATIONS:  1. Hemorrhoid and diverticulosis literature given to the patient.  2. Specific therapy for hemorrhoids as follows:  Sitz baths 20 minutes every     other day, increase fiber in the diet, recommended a tablespoon of     Benefiber each morning with breakfast.  3. Canasa 500 mg mesalamine suppositories b.i.d. x3 weeks.  4. Follow up on pathology.  5. Further recommendations to follow.                                                 [name], M.D.    RMR/MEDQ  D:  02/18/2002  T:  02/18/2002  Job:  [id number]

## 2010-07-15 NOTE — Procedures (Signed)
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   EXAM:  Using color-flow imaging and pulse Doppler spectral analysis, the  right and left common femoral, superficial femoral, popliteal, posterior  tibial, greater and lesser saphenous veins are evaluated.  There is no  evidence suggesting deep venous insufficiency in the right and left  lower extremity.   The right saphenofemoral junction is incompetent.  The right and left  GSV is not competent with the caliber as described below.   The left lesser saphenous vein demonstrates incompetency.   GSV Diameter (used if found to be incompetent only)                                            Right    Left  Proximal Greater Saphenous Vein           0.64 cm  0.51 cm  Proximal-to-mid-thigh                     0.64 cm  0.51 cm  Mid thigh                                 0.73 cm  0.43 cm  Mid-distal thigh                          0.73 cm  0.43 cm  Distal thigh                              0.68 cm  0.47 cm  Knee                                      0.84 cm  0.39 cm   IMPRESSION:  1. The right and left greater saphenous vein reflux is identified with      the caliber ranging from on the right is 0.84 cm to 0.87 cm knee to      groin, and on the left is 0.39 cm to 0.82 cm, knee to groin.  2. 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  3. The right and left greater saphenous vein is not tortuous.  4. The deep system is competent.  5. The left lesser saphenous vein is not competent.  6. No evidence of deep venous thrombosis noted in bilateral legs.   ___________________________________________  Quita Skye Hart Rochester, M.D.   MG/MEDQ  D:  04/16/2007  T:  04/17/2007  Job:  [id number]

## 2010-09-29 ENCOUNTER — Ambulatory Visit (INDEPENDENT_AMBULATORY_CARE_PROVIDER_SITE_OTHER): Payer: Medicare Other | Admitting: Otolaryngology

## 2010-09-29 DIAGNOSIS — H60339 Swimmer's ear, unspecified ear: Secondary | ICD-10-CM

## 2011-03-09 DIAGNOSIS — D235 Other benign neoplasm of skin of trunk: Secondary | ICD-10-CM | POA: Diagnosis not present

## 2011-03-09 DIAGNOSIS — L821 Other seborrheic keratosis: Secondary | ICD-10-CM | POA: Diagnosis not present

## 2011-03-09 DIAGNOSIS — B079 Viral wart, unspecified: Secondary | ICD-10-CM | POA: Diagnosis not present

## 2011-03-09 DIAGNOSIS — L988 Other specified disorders of the skin and subcutaneous tissue: Secondary | ICD-10-CM | POA: Diagnosis not present

## 2011-04-21 DIAGNOSIS — Z125 Encounter for screening for malignant neoplasm of prostate: Secondary | ICD-10-CM | POA: Diagnosis not present

## 2011-04-21 DIAGNOSIS — Z79899 Other long term (current) drug therapy: Secondary | ICD-10-CM | POA: Diagnosis not present

## 2011-04-28 DIAGNOSIS — K644 Residual hemorrhoidal skin tags: Secondary | ICD-10-CM | POA: Diagnosis not present

## 2011-04-28 DIAGNOSIS — Z1212 Encounter for screening for malignant neoplasm of rectum: Secondary | ICD-10-CM | POA: Diagnosis not present

## 2011-04-28 DIAGNOSIS — N529 Male erectile dysfunction, unspecified: Secondary | ICD-10-CM | POA: Diagnosis not present

## 2011-04-28 DIAGNOSIS — I498 Other specified cardiac arrhythmias: Secondary | ICD-10-CM | POA: Diagnosis not present

## 2011-04-28 DIAGNOSIS — N400 Enlarged prostate without lower urinary tract symptoms: Secondary | ICD-10-CM | POA: Diagnosis not present

## 2011-05-23 ENCOUNTER — Ambulatory Visit (INDEPENDENT_AMBULATORY_CARE_PROVIDER_SITE_OTHER): Payer: Medicare Other | Admitting: Urology

## 2011-05-23 DIAGNOSIS — R972 Elevated prostate specific antigen [PSA]: Secondary | ICD-10-CM | POA: Diagnosis not present

## 2011-05-23 DIAGNOSIS — N400 Enlarged prostate without lower urinary tract symptoms: Secondary | ICD-10-CM

## 2011-05-23 DIAGNOSIS — N529 Male erectile dysfunction, unspecified: Secondary | ICD-10-CM

## 2011-08-01 ENCOUNTER — Ambulatory Visit (INDEPENDENT_AMBULATORY_CARE_PROVIDER_SITE_OTHER): Payer: Medicare Other | Admitting: Urology

## 2011-08-01 DIAGNOSIS — R972 Elevated prostate specific antigen [PSA]: Secondary | ICD-10-CM | POA: Diagnosis not present

## 2011-08-01 DIAGNOSIS — N529 Male erectile dysfunction, unspecified: Secondary | ICD-10-CM | POA: Diagnosis not present

## 2011-08-01 DIAGNOSIS — N400 Enlarged prostate without lower urinary tract symptoms: Secondary | ICD-10-CM | POA: Diagnosis not present

## 2011-09-04 DIAGNOSIS — IMO0002 Reserved for concepts with insufficient information to code with codable children: Secondary | ICD-10-CM | POA: Diagnosis not present

## 2011-09-04 DIAGNOSIS — R972 Elevated prostate specific antigen [PSA]: Secondary | ICD-10-CM | POA: Diagnosis not present

## 2011-11-28 DIAGNOSIS — Z23 Encounter for immunization: Secondary | ICD-10-CM | POA: Diagnosis not present

## 2011-12-31 DIAGNOSIS — H43819 Vitreous degeneration, unspecified eye: Secondary | ICD-10-CM | POA: Diagnosis not present

## 2011-12-31 DIAGNOSIS — H35369 Drusen (degenerative) of macula, unspecified eye: Secondary | ICD-10-CM | POA: Diagnosis not present

## 2011-12-31 DIAGNOSIS — H2510 Age-related nuclear cataract, unspecified eye: Secondary | ICD-10-CM | POA: Diagnosis not present

## 2012-01-29 DIAGNOSIS — H43819 Vitreous degeneration, unspecified eye: Secondary | ICD-10-CM | POA: Diagnosis not present

## 2012-01-29 DIAGNOSIS — H2510 Age-related nuclear cataract, unspecified eye: Secondary | ICD-10-CM | POA: Diagnosis not present

## 2012-01-29 DIAGNOSIS — H35369 Drusen (degenerative) of macula, unspecified eye: Secondary | ICD-10-CM | POA: Diagnosis not present

## 2012-03-18 ENCOUNTER — Telehealth: Payer: Self-pay

## 2012-03-18 ENCOUNTER — Encounter (INDEPENDENT_AMBULATORY_CARE_PROVIDER_SITE_OTHER): Payer: Medicare Other | Admitting: *Deleted

## 2012-03-18 ENCOUNTER — Ambulatory Visit (INDEPENDENT_AMBULATORY_CARE_PROVIDER_SITE_OTHER): Payer: Medicare Other | Admitting: Vascular Surgery

## 2012-03-18 ENCOUNTER — Encounter: Payer: Self-pay | Admitting: Vascular Surgery

## 2012-03-18 VITALS — BP 128/86 | HR 66 | Resp 16 | Ht 68.5 in | Wt 160.0 lb

## 2012-03-18 DIAGNOSIS — I8000 Phlebitis and thrombophlebitis of superficial vessels of unspecified lower extremity: Secondary | ICD-10-CM | POA: Insufficient documentation

## 2012-03-18 DIAGNOSIS — M79609 Pain in unspecified limb: Secondary | ICD-10-CM

## 2012-03-18 DIAGNOSIS — R0989 Other specified symptoms and signs involving the circulatory and respiratory systems: Secondary | ICD-10-CM

## 2012-03-18 DIAGNOSIS — I83893 Varicose veins of bilateral lower extremities with other complications: Secondary | ICD-10-CM | POA: Insufficient documentation

## 2012-03-18 DIAGNOSIS — IMO0001 Reserved for inherently not codable concepts without codable children: Secondary | ICD-10-CM

## 2012-03-18 NOTE — Progress Notes (Signed)
Subjective:     Patient ID: Steve Hawkins, male   DOB: 1940/05/18, 72 y.o.   MRN: [id number]  HPI this 72 year old male is status post laser ablation right great saphenous vein in June 2009. One week later he had documented closure of the right great saphenous vein by duplex scanning. He also had greater than 20 stab phlebectomy performed of secondary varicosities. He subsequently underwent laser ablation of the left great saphenous vein with stab phlebectomy and eventually laser ablation of the left small saphenous vein. He has done well for the past few years but 5 days ago developed pain medially in the right ankle area which has progressed up to the below-knee area of the great saphenous vein. He has had no chills fever chest pain shortness of breath hemoptysis or other symptoms. He has had some edema in the right ankle which has developed over the past few years. He does wear elastic compression stockings 20-30 mm gradient.  No past medical history on file.  History  Substance Use Topics  . Smoking status: Not on file  . Smokeless tobacco: Not on file  . Alcohol Use: Not on file    No family history on file.  No Known Allergies  Current outpatient prescriptions:sildenafil (VIAGRA) 100 MG tablet, Take 100 mg by mouth daily as needed., Disp: , Rfl:   BP 128/86  Pulse 66  Resp 16  Ht 5' 8.5" (1.74 m)  Wt 160 lb (72.576 kg)  BMI 23.97 kg/m2  Body mass index is 23.97 kg/(m^2).          Review of Systems denies chest pain, dyspnea on exertion, PND, orthopnea, hemoptysis, claudication to    Objective:   Physical Exam blood pressure 120/86 heart rate 66 respirations 16 Gen.-alert and oriented x3 in no apparent distress HEENT normal for age Lungs no rhonchi or wheezing Cardiovascular regular rhythm no murmurs carotid pulses 3+ palpable no bruits audible Abdomen soft nontender no palpable masses Musculoskeletal free of  major deformities Skin clear -no rashes Neurologic  normal Lower extremities 3+ femoral and dorsalis pedis pulses palpable bilaterally  Right leg has tenderness over the great saphenous vein from the medial malleolus to just below the knee with thrombus palpable. There are some recurrent varicosities along the great saphenous system below the knee and he does have 1+ edema no hyperpigmentation or ulceration. Left leg has a few varicosities in the distal thigh over the great saphenous system and posteriorly in the left calf. 1+ distal edema on the left is present but no ulceration.  Today I ordered a venous duplex exam of the right leg which are reviewed and interpreted. He does have superficial thrombophlebitis in the great saphenous vein from the distal calf to the proximal calf. There is no DVT. The right great saphenous vein has gross reflux and is totally recanalized since 2009 with a large caliber vein up to the saphenofemoral junction with gross reflux throughout the GS V. to the knee level.      Assessment:     #1 acute superficial thrombophlebitis right great saphenous vein from ankle to proximal calf-began 4 days ago #2 total recanalization right great saphenous system 4 years post laser ablation for painful varicosities-accompanied by stab phlebectomy with recurrent varicose veins presently in addition to #1    Plan:     #1 long-leg elastic compression stockings 20-30 mm gradient #2 heat-warm compresses to right calf area #3 aspirin on daily basis #4 ibuprofen for pain #5 elevate legs on  daily basis #6 patient to return in 3 months. We'll reassess him with formal reflux study of both lower extremities. Suspect he will need laser ablation right great saphenous vein and also reassess left saphenous system. If she should develop proximal progression of thrombophlebitis into the mid thigh or higher he will be in touch with [place] for repeat duplex exam-otherwise will not require anticoagulation other than aspirin

## 2012-03-18 NOTE — Telephone Encounter (Signed)
Phone call from pt.  Reports onset of right lower leg pain on 03/14/12.  Describes a "black vein that runs from ankle to knee, and is tender and hot."  Unable to see if right leg is swelled in comparison to left leg.  States it is painful to stand on the right leg.  States he started taking ASA 81 mg on Thursday, and has been wearing compression stocking and elevating the leg.  Discussed w/ Dr. Hart Rochester.  Rec'd new order for right lower extremity venous duplex, and office visit today.  Pt. notified.  Verb. Understanding.

## 2012-03-22 ENCOUNTER — Other Ambulatory Visit: Payer: Self-pay | Admitting: *Deleted

## 2012-03-22 DIAGNOSIS — I83893 Varicose veins of bilateral lower extremities with other complications: Secondary | ICD-10-CM

## 2012-04-02 DIAGNOSIS — R972 Elevated prostate specific antigen [PSA]: Secondary | ICD-10-CM | POA: Diagnosis not present

## 2012-04-09 ENCOUNTER — Ambulatory Visit (INDEPENDENT_AMBULATORY_CARE_PROVIDER_SITE_OTHER): Payer: Medicare Other | Admitting: Urology

## 2012-04-09 DIAGNOSIS — R972 Elevated prostate specific antigen [PSA]: Secondary | ICD-10-CM

## 2012-04-09 DIAGNOSIS — N400 Enlarged prostate without lower urinary tract symptoms: Secondary | ICD-10-CM | POA: Diagnosis not present

## 2012-04-09 DIAGNOSIS — N529 Male erectile dysfunction, unspecified: Secondary | ICD-10-CM | POA: Diagnosis not present

## 2012-05-15 DIAGNOSIS — R7301 Impaired fasting glucose: Secondary | ICD-10-CM | POA: Diagnosis not present

## 2012-05-15 DIAGNOSIS — N529 Male erectile dysfunction, unspecified: Secondary | ICD-10-CM | POA: Diagnosis not present

## 2012-05-21 ENCOUNTER — Other Ambulatory Visit (HOSPITAL_COMMUNITY): Payer: Self-pay | Admitting: Internal Medicine

## 2012-05-21 DIAGNOSIS — R05 Cough: Secondary | ICD-10-CM | POA: Diagnosis not present

## 2012-05-21 DIAGNOSIS — N400 Enlarged prostate without lower urinary tract symptoms: Secondary | ICD-10-CM | POA: Diagnosis not present

## 2012-05-21 DIAGNOSIS — M5137 Other intervertebral disc degeneration, lumbosacral region: Secondary | ICD-10-CM | POA: Diagnosis not present

## 2012-06-03 ENCOUNTER — Ambulatory Visit (HOSPITAL_COMMUNITY)
Admission: RE | Admit: 2012-06-03 | Discharge: 2012-06-03 | Disposition: A | Discharge status: Home / Self Care | Payer: Medicare Other | Source: Ambulatory Visit | Attending: Internal Medicine | Admitting: Internal Medicine

## 2012-06-03 DIAGNOSIS — R05 Cough: Secondary | ICD-10-CM | POA: Insufficient documentation

## 2012-06-03 DIAGNOSIS — R911 Solitary pulmonary nodule: Secondary | ICD-10-CM | POA: Diagnosis not present

## 2012-06-03 DIAGNOSIS — Z87891 Personal history of nicotine dependence: Secondary | ICD-10-CM | POA: Insufficient documentation

## 2012-06-03 DIAGNOSIS — R059 Cough, unspecified: Secondary | ICD-10-CM | POA: Diagnosis not present

## 2012-06-03 DIAGNOSIS — I2510 Atherosclerotic heart disease of native coronary artery without angina pectoris: Secondary | ICD-10-CM | POA: Insufficient documentation

## 2012-06-17 ENCOUNTER — Encounter: Payer: Self-pay | Admitting: Vascular Surgery

## 2012-06-18 ENCOUNTER — Encounter: Payer: Self-pay | Admitting: Vascular Surgery

## 2012-06-18 ENCOUNTER — Encounter (INDEPENDENT_AMBULATORY_CARE_PROVIDER_SITE_OTHER): Payer: Medicare Other | Admitting: *Deleted

## 2012-06-18 ENCOUNTER — Ambulatory Visit (INDEPENDENT_AMBULATORY_CARE_PROVIDER_SITE_OTHER): Payer: Medicare Other | Admitting: Vascular Surgery

## 2012-06-18 VITALS — BP 130/81 | HR 61 | Resp 16 | Ht 68.5 in | Wt 160.0 lb

## 2012-06-18 DIAGNOSIS — I83893 Varicose veins of bilateral lower extremities with other complications: Secondary | ICD-10-CM

## 2012-06-18 NOTE — Progress Notes (Signed)
Subjective:     Patient ID: Steve Hawkins, male   DOB: April 25, 1940, 72 y.o.   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 He had laser ablation of his right great saphenous vein in 2009 in the left great and small saphenous veins at that same time.Marland Kitchen He was free of varicosities for several years but has developed some recurrent varicosities in the left thigh and calf and posterior calf area. He also had the episode of right calf thrombophlebitis mentioned above. His right leg feels better now. He does have some swelling in the left ankle. He has been wearing long light elastic compression stockings 20-30 mm gradient as well as trying ibuprofen and elevation the last 3 months. He continues to have significant symptoms of aching and throbbing in his left calf and distal thigh area. He has no history of stasis ulcers.  History reviewed. No pertinent past medical history.  History  Substance Use Topics  . Smoking status: Former Games developer  . Smokeless tobacco: Not on file  . Alcohol Use: 2.2 oz/week    1 Shots of liquor, 2 Drinks containing 0.5 oz of alcohol, 1 Cans of beer per week    History reviewed. No pertinent family history.  No Known Allergies  Current outpatient prescriptions:sildenafil (VIAGRA) 100 MG tablet, Take 100 mg by mouth daily as needed., Disp: , Rfl:   BP 130/81  Pulse 61  Resp 16  Ht 5' 8.5" (1.74 m)  Wt 160 lb (72.576 kg)  BMI 23.97 kg/m2  Body mass index is 23.97 kg/(m^2).          Review of Systems denies chest pain, dyspnea on exertion, PND, orthopnea, hemoptysis    Objective:   Physical Exam blood pressure 130/81 heart rate 61 respirations 16 General well-developed well-nourished male in no apparent stress alert and oriented x3 Lungs no rhonchi or wheezing Left leg with bulging varicosities in the distal medial thigh and into the left  popliteal fossa and posterior calf. 1+ edema distally with 2+ dorsalis pedis pulse palpable on the left. Right leg with smaller varicosities in the distal medial calf with evidence of resolving thrombophlebitis. No edema noted 2+ dorsalis pedis pulse palpable.  Today I ordered bilateral venous duplex exam which I reviewed and interpreted. Left leg has gross reflux in the great saphenous vein from the saphenofemoral junction to the mid thigh supplying these bulging varicosities. There is no DVT. Left small saphenous vein is essentially occluded near the popliteal fossa that is open distally. Right leg has occlusion of the right great saphenous vein from near the saphenofemoral junction down to the mid thigh with no DVT.     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 He should then return in 3 months to see if stab phlebectomy of secondary varicosities in the distal thigh and calf is indicated We'll proceed with precertification to perform this in the near future

## 2012-06-19 ENCOUNTER — Other Ambulatory Visit: Payer: Self-pay | Admitting: *Deleted

## 2012-06-19 DIAGNOSIS — I83893 Varicose veins of bilateral lower extremities with other complications: Secondary | ICD-10-CM

## 2012-07-23 ENCOUNTER — Ambulatory Visit (INDEPENDENT_AMBULATORY_CARE_PROVIDER_SITE_OTHER): Payer: Medicare Other | Admitting: Urology

## 2012-07-23 DIAGNOSIS — N400 Enlarged prostate without lower urinary tract symptoms: Secondary | ICD-10-CM | POA: Diagnosis not present

## 2012-07-23 DIAGNOSIS — R972 Elevated prostate specific antigen [PSA]: Secondary | ICD-10-CM | POA: Diagnosis not present

## 2012-07-23 DIAGNOSIS — N401 Enlarged prostate with lower urinary tract symptoms: Secondary | ICD-10-CM | POA: Diagnosis not present

## 2012-07-23 DIAGNOSIS — N529 Male erectile dysfunction, unspecified: Secondary | ICD-10-CM

## 2012-07-26 ENCOUNTER — Encounter: Payer: Self-pay | Admitting: Vascular Surgery

## 2012-07-29 ENCOUNTER — Ambulatory Visit (INDEPENDENT_AMBULATORY_CARE_PROVIDER_SITE_OTHER): Payer: Medicare Other | Admitting: Vascular Surgery

## 2012-07-29 ENCOUNTER — Encounter: Payer: Self-pay | Admitting: Vascular Surgery

## 2012-07-29 DIAGNOSIS — I83893 Varicose veins of bilateral lower extremities with other complications: Secondary | ICD-10-CM | POA: Diagnosis not present

## 2012-07-29 NOTE — Progress Notes (Signed)
Subjective:     Patient ID: Steve Hawkins, male   DOB: 1940/10/13, 72 y.o.   MRN: [id number]  HPI this 72 year old male had laser ablation of the left great saphenous vein performed under local tumescent anesthesia for venous hypertension with pain and swelling. A total of 1168 J of energy was utilized. He tolerated the procedure well.   Review of Systems     Objective:   Physical Exam       Assessment:     Well-tolerated laser ablation left great saphenous vein performed under local tumescent anesthesia for venous hypertension with pain and swelling    Plan:     Return June 11 for venous duplex exam left leg to confirm closure left great saphenous vein Within return in 3 months to see if stab phlebectomy of secondary varicosities will be indicated

## 2012-07-29 NOTE — Progress Notes (Signed)
Laser Ablation Procedure      Date: 07/29/2012    Steve Hawkins DOB:Jul 09, 1940  Consent signed: Yes  Surgeon:J.D. Hart [place]  Procedure: Laser Ablation: left Greater Saphenous Vein  There were no vitals taken for this visit.  Start time: 11:20   End time: 12:00  Tumescent Anesthesia: 200 cc 0.9% NaCl with 50 cc Lidocaine HCL with 1% Epi and 15 cc 8.4% NaHCO3  Local Anesthesia: 3 cc Lidocaine HCL and NaHCO3 (ratio 2:1)  Pulsed mode:yes Watts 15 Seconds 1 Pulses:1 Total Pulses:78 Total Energy: 1168 Total Time: 1:17     Patient tolerated procedure well: Yes  Notes:   Description of Procedure:  After marking the course of the saphenous vein and the secondary varicosities in the standing position, the patient was placed on the operating table in the supine position, and the left leg was prepped and draped in sterile fashion. Local anesthetic was administered, and under ultrasound guidance the saphenous vein was accessed with a micro needle and guide wire; then the micro puncture sheath was placed. A guide wire was inserted to the saphenofemoral junction, followed by a 5 french sheath.  The position of the sheath and then the laser fiber below the junction was confirmed using the ultrasound and visualization of the aiming beam.  Tumescent anesthesia was administered along the course of the saphenous vein using ultrasound guidance. Protective laser glasses were placed on the patient, and the laser was fired at 15 watt pulsed mode advancing 1-2 mm per sec.  For a total of 1168 joules.  A steri strip was applied to the puncture site.    ABD pads and thigh high compression stockings were applied.  Ace wrap bandages were applied over the phlebectomy sites and at the top of the saphenofemoral junction.  Blood loss was less than 15 cc.  The patient ambulated out of the operating room having tolerated the procedure well.

## 2012-07-30 ENCOUNTER — Telehealth: Payer: Self-pay | Admitting: *Deleted

## 2012-07-30 NOTE — Telephone Encounter (Signed)
Pt doing well. No problems. Reminded him of his fu appt next week.

## 2012-08-05 ENCOUNTER — Ambulatory Visit: Payer: Medicare Other | Admitting: Vascular Surgery

## 2012-08-07 ENCOUNTER — Encounter: Payer: Self-pay | Admitting: Vascular Surgery

## 2012-08-08 ENCOUNTER — Ambulatory Visit (INDEPENDENT_AMBULATORY_CARE_PROVIDER_SITE_OTHER): Payer: Medicare Other | Admitting: Vascular Surgery

## 2012-08-08 ENCOUNTER — Ambulatory Visit: Payer: Medicare Other | Admitting: Vascular Surgery

## 2012-08-08 ENCOUNTER — Encounter (INDEPENDENT_AMBULATORY_CARE_PROVIDER_SITE_OTHER): Payer: Medicare Other | Admitting: *Deleted

## 2012-08-08 ENCOUNTER — Encounter: Payer: Self-pay | Admitting: Vascular Surgery

## 2012-08-08 VITALS — BP 129/75 | HR 75 | Resp 18 | Ht 68.5 in | Wt 160.0 lb

## 2012-08-08 DIAGNOSIS — Z48812 Encounter for surgical aftercare following surgery on the circulatory system: Secondary | ICD-10-CM

## 2012-08-08 DIAGNOSIS — I83893 Varicose veins of bilateral lower extremities with other complications: Secondary | ICD-10-CM | POA: Diagnosis not present

## 2012-08-08 NOTE — Progress Notes (Signed)
Subjective:     Patient ID: Steve Hawkins, male   DOB: November 29, 1940, 72 y.o.   MRN: [id number]  HPI this 72 year old male returns 10 days post laser ablation left great saphenous vein for venous hypertension with distal edema and painful varicosities. He has had mild discomfort along course of great saphenous vein. He has noted improvement in the distal edema. Varicosities continued to have some discomfort. He is wearing long-leg elastic compression stockings 20-30 mm gradient and took ibuprofen as instructed.  Past Medical History  Diagnosis Date  . Varicose veins     History  Substance Use Topics  . Smoking status: Former Games developer  . Smokeless tobacco: Never Used  . Alcohol Use: 2.2 oz/week    1 Shots of liquor, 2 Drinks containing 0.5 oz of alcohol, 1 Cans of beer per week    No family history on file.  No Known Allergies  Current outpatient prescriptions:sildenafil (VIAGRA) 100 MG tablet, Take 100 mg by mouth daily as needed., Disp: , Rfl:   BP 129/75  Pulse 75  Resp 18  Ht 5' 8.5" (1.74 m)  Wt 160 lb (72.576 kg)  BMI 23.97 kg/m2  Body mass index is 23.97 kg/(m^2).          Review of Systems denies chest pain, dyspnea on exertion, PND, orthopnea, claudication    Objective:   Physical Exam blood pressure 129/75 heart rate 75 respirations 18 General well-developed well-nourished male in no apparent stress alert and oriented x3 Lungs no rhonchi or wheezing Left leg with mild discomfort along course of great saphenous vein. Minimal distal edema. Bulging varicosities in the left posterior calf and medial thigh.  Today I ordered a venous duplex exam of the left leg which are reviewed and interpreted. There is no DVT. Great saphenous vein is occluded in the distal thigh up to the saphenofemoral junction but no DVT.     Assessment:     Successful laser ablation left great saphenous vein for pain and swelling    Plan:     Return in 3 months to see if stab  phlebectomy of secondary varicosities will be necessary to completely relieve symptoms

## 2012-08-19 DIAGNOSIS — Z0279 Encounter for issue of other medical certificate: Secondary | ICD-10-CM

## 2012-10-24 ENCOUNTER — Other Ambulatory Visit (HOSPITAL_COMMUNITY): Payer: Self-pay | Admitting: Internal Medicine

## 2012-10-24 DIAGNOSIS — R911 Solitary pulmonary nodule: Secondary | ICD-10-CM

## 2012-10-29 ENCOUNTER — Ambulatory Visit (HOSPITAL_COMMUNITY)
Admission: RE | Admit: 2012-10-29 | Discharge: 2012-10-29 | Disposition: A | Discharge status: Home / Self Care | Payer: Medicare Other | Source: Ambulatory Visit | Attending: Internal Medicine | Admitting: Internal Medicine

## 2012-10-29 DIAGNOSIS — R911 Solitary pulmonary nodule: Secondary | ICD-10-CM | POA: Insufficient documentation

## 2012-10-29 DIAGNOSIS — Z09 Encounter for follow-up examination after completed treatment for conditions other than malignant neoplasm: Secondary | ICD-10-CM | POA: Diagnosis not present

## 2012-11-04 ENCOUNTER — Encounter: Payer: Self-pay | Admitting: Vascular Surgery

## 2012-11-05 ENCOUNTER — Ambulatory Visit (INDEPENDENT_AMBULATORY_CARE_PROVIDER_SITE_OTHER): Payer: Medicare Other | Admitting: Vascular Surgery

## 2012-11-05 ENCOUNTER — Encounter: Payer: Self-pay | Admitting: Vascular Surgery

## 2012-11-05 VITALS — BP 119/74 | HR 54 | Resp 16 | Ht 69.0 in | Wt 160.0 lb

## 2012-11-05 DIAGNOSIS — I83893 Varicose veins of bilateral lower extremities with other complications: Secondary | ICD-10-CM | POA: Diagnosis not present

## 2012-11-05 NOTE — Progress Notes (Signed)
Subjective:     Patient ID: Steve Hawkins, male   DOB: 29-Nov-1940, 72 y.o.   MRN: [id number]  HPI this 72 year old male returns for continued followup regarding his venous insufficiency of the left leg. He had a laser ablation of left great saphenous vein most recently performed in June of 2014. Previously had the left small saphenous vein and great saphenous vein closed in 2009 and 2010. Since his last procedure he has noted some edema in the left ankle area. He did have a confirmed closure of the left great saphenous vein on duplex scanning week after his procedure in June. He is having some mild discomfort involving varicosities in the posterior calf.  Past Medical History  Diagnosis Date  . Varicose veins     History  Substance Use Topics  . Smoking status: Former Games developer  . Smokeless tobacco: Never Used  . Alcohol Use: 2.2 oz/week    1 Shots of liquor, 2 Drinks containing 0.5 oz of alcohol, 1 Cans of beer per week    No family history on file.  No Known Allergies  Current outpatient prescriptions:sildenafil (VIAGRA) 100 MG tablet, Take 100 mg by mouth daily as needed., Disp: , Rfl:   BP 119/74  Pulse 54  Resp 16  Ht 5\' 9"  (1.753 m)  Wt 160 lb (72.576 kg)  BMI 23.62 kg/m2  Body mass index is 23.62 kg/(m^2).          Review of Systems denies chest pain, dyspnea on exertion, PND, orthopnea, hemoptysis    Objective:   Physical Exam BP 119/74  Pulse 54  Resp 16  Ht 5\' 9"  (1.753 m)  Wt 160 lb (72.576 kg)  BMI 23.62 kg/m2  General well-developed well-nourished male no apparent stress alert and oriented x3 Lungs no rhonchi or wheezing Left lower extremity with 1+ edema distally. No active ulceration noted. Bulging varicosities which are less tense than previously are located in the popliteal fossa and the proximal calf. No hyperpigmentation distally.  Today I independently visualized the left great saphenous vein with the SonoSite which does appear closed and  also the left small saphenous vein which is occluded as well.      Assessment:     Doing well post laser ablation left great saphenous vein Patient does not desire stab phlebectomy at the present time    Plan:     #1 long-leg elastic compression stockings if he is having significant edema #2 return to see [place] on when necessary basis

## 2012-11-11 DIAGNOSIS — L570 Actinic keratosis: Secondary | ICD-10-CM | POA: Diagnosis not present

## 2012-11-11 DIAGNOSIS — D235 Other benign neoplasm of skin of trunk: Secondary | ICD-10-CM | POA: Diagnosis not present

## 2012-11-11 DIAGNOSIS — L820 Inflamed seborrheic keratosis: Secondary | ICD-10-CM | POA: Diagnosis not present

## 2012-11-12 DIAGNOSIS — H2510 Age-related nuclear cataract, unspecified eye: Secondary | ICD-10-CM | POA: Diagnosis not present

## 2013-02-18 ENCOUNTER — Ambulatory Visit (INDEPENDENT_AMBULATORY_CARE_PROVIDER_SITE_OTHER): Payer: Medicare Other | Admitting: Urology

## 2013-02-18 DIAGNOSIS — N401 Enlarged prostate with lower urinary tract symptoms: Secondary | ICD-10-CM

## 2013-02-18 DIAGNOSIS — N529 Male erectile dysfunction, unspecified: Secondary | ICD-10-CM | POA: Diagnosis not present

## 2013-02-18 DIAGNOSIS — N400 Enlarged prostate without lower urinary tract symptoms: Secondary | ICD-10-CM | POA: Diagnosis not present

## 2013-02-18 DIAGNOSIS — R972 Elevated prostate specific antigen [PSA]: Secondary | ICD-10-CM | POA: Diagnosis not present

## 2013-05-20 DIAGNOSIS — Z79899 Other long term (current) drug therapy: Secondary | ICD-10-CM | POA: Diagnosis not present

## 2013-05-20 DIAGNOSIS — N529 Male erectile dysfunction, unspecified: Secondary | ICD-10-CM | POA: Diagnosis not present

## 2013-05-20 DIAGNOSIS — R7301 Impaired fasting glucose: Secondary | ICD-10-CM | POA: Diagnosis not present

## 2013-05-20 DIAGNOSIS — Z125 Encounter for screening for malignant neoplasm of prostate: Secondary | ICD-10-CM | POA: Diagnosis not present

## 2013-05-26 DIAGNOSIS — E0510 Thyrotoxicosis with toxic single thyroid nodule without thyrotoxic crisis or storm: Secondary | ICD-10-CM | POA: Diagnosis not present

## 2013-05-26 DIAGNOSIS — G459 Transient cerebral ischemic attack, unspecified: Secondary | ICD-10-CM | POA: Diagnosis not present

## 2013-05-26 DIAGNOSIS — C61 Malignant neoplasm of prostate: Secondary | ICD-10-CM | POA: Diagnosis not present

## 2013-06-12 ENCOUNTER — Other Ambulatory Visit (HOSPITAL_COMMUNITY): Payer: Self-pay | Admitting: Internal Medicine

## 2013-06-12 DIAGNOSIS — R911 Solitary pulmonary nodule: Secondary | ICD-10-CM

## 2013-06-16 ENCOUNTER — Other Ambulatory Visit (HOSPITAL_COMMUNITY): Payer: Medicare Other

## 2013-06-24 ENCOUNTER — Ambulatory Visit (HOSPITAL_COMMUNITY)
Admission: RE | Admit: 2013-06-24 | Discharge: 2013-06-24 | Disposition: A | Discharge status: Home / Self Care | Payer: Medicare Other | Source: Ambulatory Visit | Attending: Internal Medicine | Admitting: Internal Medicine

## 2013-06-24 DIAGNOSIS — R911 Solitary pulmonary nodule: Secondary | ICD-10-CM | POA: Insufficient documentation

## 2013-09-03 DIAGNOSIS — L03019 Cellulitis of unspecified finger: Secondary | ICD-10-CM | POA: Diagnosis not present

## 2013-09-10 ENCOUNTER — Encounter (HOSPITAL_COMMUNITY): Payer: Self-pay | Admitting: Emergency Medicine

## 2013-09-10 ENCOUNTER — Emergency Department (HOSPITAL_COMMUNITY)
Admission: EM | Admit: 2013-09-10 | Discharge: 2013-09-10 | Disposition: A | Discharge status: Home / Self Care | Payer: Medicare Other | Attending: Emergency Medicine | Admitting: Emergency Medicine

## 2013-09-10 DIAGNOSIS — Z8679 Personal history of other diseases of the circulatory system: Secondary | ICD-10-CM | POA: Diagnosis not present

## 2013-09-10 DIAGNOSIS — Z792 Long term (current) use of antibiotics: Secondary | ICD-10-CM | POA: Diagnosis not present

## 2013-09-10 DIAGNOSIS — T63481A Toxic effect of venom of other arthropod, accidental (unintentional), initial encounter: Secondary | ICD-10-CM

## 2013-09-10 DIAGNOSIS — Y9289 Other specified places as the place of occurrence of the external cause: Secondary | ICD-10-CM | POA: Diagnosis not present

## 2013-09-10 DIAGNOSIS — T63461A Toxic effect of venom of wasps, accidental (unintentional), initial encounter: Secondary | ICD-10-CM | POA: Insufficient documentation

## 2013-09-10 DIAGNOSIS — Z87891 Personal history of nicotine dependence: Secondary | ICD-10-CM | POA: Insufficient documentation

## 2013-09-10 DIAGNOSIS — T6391XA Toxic effect of contact with unspecified venomous animal, accidental (unintentional), initial encounter: Secondary | ICD-10-CM | POA: Insufficient documentation

## 2013-09-10 DIAGNOSIS — Y990 Civilian activity done for income or pay: Secondary | ICD-10-CM | POA: Diagnosis not present

## 2013-09-10 DIAGNOSIS — Y939 Activity, unspecified: Secondary | ICD-10-CM | POA: Diagnosis not present

## 2013-09-10 DIAGNOSIS — Z79899 Other long term (current) drug therapy: Secondary | ICD-10-CM | POA: Diagnosis not present

## 2013-09-10 NOTE — ED Notes (Signed)
Pt states he was working in his yard, and was bite several times by yellow jackets. Per patient he has reactions to these particular stings "sometimes", which has cause difficulty breathing and needing to be "packed in ice". Pt diaphetic, however he states this he was sweaty prior to stings from working in his yard.

## 2013-09-14 NOTE — ED Provider Notes (Signed)
CSN: [id number]     Arrival date & time 09/10/13  1134 History   First MD Initiated Contact with Patient 09/10/13 1217     Chief Complaint  Patient presents with  . Allergic Reaction     (Consider location/radiation/quality/duration/timing/severity/associated sxs/prior Treatment) HPI Comments: Patient is a 73 year old male who presents to the emergency department with" possible allergic reaction". The patient states that he was working in the yard when he was bit several times by yellow jackets. He noticed increased redness of the areas of his yellow jacket stings. The patient denied any difficulty with breathing, any swelling of face her tongue, any changes in concentration, or severe reactions of any time. The patient states that in the past he had to be" packed in ice" because of a bad reaction. He became frightened when he had multiple stings and presented to the emergency department.  Patient is a 73 y.o. male presenting with allergic reaction. The history is provided by the patient and the spouse. No language interpreter was used.  Allergic Reaction Presenting symptoms: no wheezing     Past Medical History  Diagnosis Date  . Varicose veins    Past Surgical History  Procedure Laterality Date  . Cholecystectomy    . Endovenous ablation saphenous vein w/ laser     History reviewed. No pertinent family history. History  Substance Use Topics  . Smoking status: Former Research scientist (life sciences)  . Smokeless tobacco: Never Used  . Alcohol Use: 2.2 oz/week    1 Shots of liquor, 2 Drinks containing 0.5 oz of alcohol, 1 Cans of beer per week    Review of Systems  Constitutional: Negative for activity change.       All ROS Neg except as noted in HPI  HENT: Negative for nosebleeds.   Eyes: Negative for photophobia and discharge.  Respiratory: Negative for cough, shortness of breath and wheezing.   Cardiovascular: Negative for chest pain and palpitations.  Gastrointestinal: Negative for abdominal pain  and blood in stool.  Genitourinary: Negative for dysuria, frequency and hematuria.  Musculoskeletal: Negative for arthralgias, back pain and neck pain.  Skin: Negative.   Neurological: Negative for dizziness, seizures and speech difficulty.  Psychiatric/Behavioral: Negative for hallucinations and confusion.      Allergies  Bee venom  Home Medications   Prior to Admission medications   Medication Sig Start Date End Date Taking? Authorizing Provider  doxycycline (VIBRAMYCIN) 100 MG capsule Take 1 capsule by mouth 2 (two) times daily. 09/03/13  Yes Historical Provider, MD  finasteride (PROSCAR) 5 MG tablet Take 1 tablet by mouth daily. 06/21/13  Yes Historical Provider, MD  sildenafil (VIAGRA) 100 MG tablet Take 100 mg by mouth daily as needed for erectile dysfunction.    Yes Historical Provider, MD  tamsulosin (FLOMAX) 0.4 MG CAPS capsule Take 1 capsule by mouth daily. 09/10/13  Yes Historical Provider, MD   BP 124/79  Pulse 111  Temp(Src) 97.6 F (36.4 C) (Oral)  Resp 18  Ht 5' 8.5" (1.74 m)  Wt 155 lb (70.308 kg)  BMI 23.22 kg/m2  SpO2 97% Physical Exam  Nursing note and vitals reviewed. Constitutional: He is oriented to person, place, and time. He appears well-developed and well-nourished.  Non-toxic appearance.  HENT:  Head: Normocephalic.  Right Ear: Tympanic membrane and external ear normal.  Left Ear: Tympanic membrane and external ear normal.  Eyes: EOM and lids are normal. Pupils are equal, round, and reactive to light.  Neck: Normal range of motion. Neck supple. Carotid bruit  is not present.  Cardiovascular: Normal rate, regular rhythm, normal heart sounds, intact distal pulses and normal pulses.   Pulmonary/Chest: Breath sounds normal. No respiratory distress.  Lungs are clear with symmetrical rise and fall of the chest. The patient speaks in complete sentences without problem.  Abdominal: Soft. Bowel sounds are normal. There is no tenderness. There is no guarding.   Musculoskeletal: Normal range of motion.  Lymphadenopathy:       Head (right side): No submandibular adenopathy present.       Head (left side): No submandibular adenopathy present.    He has no cervical adenopathy.  Neurological: He is alert and oriented to person, place, and time. He has normal strength. No cranial nerve deficit or sensory deficit.  Skin: Skin is warm and dry.  Patient noted to have 2 stings on the left arm, 1 sting on the right hand, and a staining of the lower extremity. No excessive redness or swelling appreciated.  Psychiatric: He has a normal mood and affect. His speech is normal.    ED Course Pulse oximetry 97% on room air. Within normal limits by my interpretation.   Procedures (including critical care time) Labs Review Labs Reviewed - No data to display  Imaging Review No results found.   EKG Interpretation None      MDM No problems with shortness of breath or acute allergic response while in the emergency department. The patient states that he feels fine and would like to go home. Patient advised to return to the emergency department immediately if any changes, problems, or concerns. Patient speaking in complete sentences at the time of his leaving the emergency department.    Final diagnoses:  None    **I have reviewed nursing notes, vital signs, and all appropriate lab and imaging results for this patient.Lenox Ahr, PA-C 09/14/13 2138

## 2013-09-22 NOTE — ED Provider Notes (Signed)
Medical screening examination/treatment/procedure(s) were performed by non-physician practitioner and as supervising physician I was immediately available for consultation/collaboration.   EKG Interpretation None        Maudry Diego, MD 09/22/13 [phone]

## 2014-02-10 DIAGNOSIS — Z23 Encounter for immunization: Secondary | ICD-10-CM | POA: Diagnosis not present

## 2014-02-17 ENCOUNTER — Ambulatory Visit (INDEPENDENT_AMBULATORY_CARE_PROVIDER_SITE_OTHER): Payer: Medicare Other | Admitting: Urology

## 2014-02-17 DIAGNOSIS — N400 Enlarged prostate without lower urinary tract symptoms: Secondary | ICD-10-CM

## 2014-03-31 DIAGNOSIS — Z23 Encounter for immunization: Secondary | ICD-10-CM | POA: Diagnosis not present

## 2014-04-07 DIAGNOSIS — H2513 Age-related nuclear cataract, bilateral: Secondary | ICD-10-CM | POA: Diagnosis not present

## 2014-05-07 DIAGNOSIS — L821 Other seborrheic keratosis: Secondary | ICD-10-CM | POA: Diagnosis not present

## 2014-05-07 DIAGNOSIS — L570 Actinic keratosis: Secondary | ICD-10-CM | POA: Diagnosis not present

## 2014-05-07 DIAGNOSIS — L659 Nonscarring hair loss, unspecified: Secondary | ICD-10-CM | POA: Diagnosis not present

## 2014-05-07 DIAGNOSIS — X32XXXD Exposure to sunlight, subsequent encounter: Secondary | ICD-10-CM | POA: Diagnosis not present

## 2014-05-07 DIAGNOSIS — Z1283 Encounter for screening for malignant neoplasm of skin: Secondary | ICD-10-CM | POA: Diagnosis not present

## 2014-05-26 DIAGNOSIS — Z79899 Other long term (current) drug therapy: Secondary | ICD-10-CM | POA: Diagnosis not present

## 2014-05-26 DIAGNOSIS — Z125 Encounter for screening for malignant neoplasm of prostate: Secondary | ICD-10-CM | POA: Diagnosis not present

## 2014-06-02 DIAGNOSIS — N401 Enlarged prostate with lower urinary tract symptoms: Secondary | ICD-10-CM | POA: Diagnosis not present

## 2014-06-02 DIAGNOSIS — N528 Other male erectile dysfunction: Secondary | ICD-10-CM | POA: Diagnosis not present

## 2014-06-02 DIAGNOSIS — R001 Bradycardia, unspecified: Secondary | ICD-10-CM | POA: Diagnosis not present

## 2014-11-09 ENCOUNTER — Other Ambulatory Visit: Payer: Self-pay | Admitting: *Deleted

## 2014-11-09 ENCOUNTER — Telehealth: Payer: Self-pay | Admitting: *Deleted

## 2014-11-09 DIAGNOSIS — M79662 Pain in left lower leg: Secondary | ICD-10-CM

## 2014-11-09 DIAGNOSIS — M7989 Other specified soft tissue disorders: Principal | ICD-10-CM

## 2014-11-09 NOTE — Telephone Encounter (Signed)
Called patient regarding left leg pain and swelling.  Scheduled appointment for reflux study and to see Dr Kellie Simmering 11/17/14.  Patient is to elevate left leg whenever possible and to call if symptoms worsen.  Patient voiced understanding of these instructions.  Denied redness, warmth or fever.

## 2014-11-16 ENCOUNTER — Encounter: Payer: Self-pay | Admitting: Vascular Surgery

## 2014-11-17 ENCOUNTER — Ambulatory Visit (HOSPITAL_COMMUNITY)
Admission: RE | Admit: 2014-11-17 | Discharge: 2014-11-17 | Disposition: A | Discharge status: Home / Self Care | Payer: Medicare Other | Source: Ambulatory Visit | Attending: Vascular Surgery | Admitting: Vascular Surgery

## 2014-11-17 ENCOUNTER — Ambulatory Visit (INDEPENDENT_AMBULATORY_CARE_PROVIDER_SITE_OTHER): Payer: Medicare Other | Admitting: Vascular Surgery

## 2014-11-17 ENCOUNTER — Encounter: Payer: Self-pay | Admitting: Vascular Surgery

## 2014-11-17 VITALS — BP 113/75 | HR 82 | Temp 97.8°F | Resp 16 | Ht 68.5 in | Wt 164.0 lb

## 2014-11-17 DIAGNOSIS — I83892 Varicose veins of left lower extremities with other complications: Secondary | ICD-10-CM | POA: Diagnosis not present

## 2014-11-17 DIAGNOSIS — M7989 Other specified soft tissue disorders: Secondary | ICD-10-CM

## 2014-11-17 DIAGNOSIS — M79662 Pain in left lower leg: Secondary | ICD-10-CM

## 2014-11-17 NOTE — Progress Notes (Signed)
Subjective:     Patient ID: Steve Hawkins, male   DOB: 02-13-1941, 74 y.o.   MRN: [id number]  HPI This 74 year old male returns with continued problems with painful varicosities in the left leg. His has worsened over the past few years since I last saw him. He has had previous laser ablation of the left great saphenous vein. He has had worsening edema in the left leg over the past 2 years which has become marked in the lower third of the left leg as the day progresses. He has been wearing long leg elastic compression stockings 20-30 mm gradient as well as trying elevation ibuprofen but continues to have worsening discomfort in the left ankle and calf. Varicosities are enlarging in that area. He has no history of DVT or thrombophlebitis stasis ulcers or bleeding.  Past Medical History  Diagnosis Date  . Varicose veins     Social History  Substance Use Topics  . Smoking status: Former Research scientist (life sciences)  . Smokeless tobacco: Never Used  . Alcohol Use: 2.2 oz/week    1 Shots of liquor, 2 Standard drinks or equivalent, 1 Cans of beer per week    History reviewed. No pertinent family history.  Allergies  Allergen Reactions  . Bee Venom Anaphylaxis     Current outpatient prescriptions:  .  doxycycline (VIBRAMYCIN) 100 MG capsule, Take 1 capsule by mouth 2 (two) times daily., Disp: , Rfl:  .  finasteride (PROSCAR) 5 MG tablet, Take 1 tablet by mouth daily., Disp: , Rfl:  .  sildenafil (VIAGRA) 100 MG tablet, Take 100 mg by mouth daily as needed for erectile dysfunction. , Disp: , Rfl:  .  tamsulosin (FLOMAX) 0.4 MG CAPS capsule, Take 1 capsule by mouth daily., Disp: , Rfl:   Filed Vitals:   11/17/14 1457  BP: 113/75  Pulse: 82  Temp: 97.8 F (36.6 C)  Resp: 16  Height: 5' 8.5" (1.74 m)  Weight: 164 lb (74.39 kg)  SpO2: 100%    Body mass index is 24.57 kg/(m^2).           Review of Systems  Denies chest pain, dyspnea on exertion, PND, orthopnea, claudication     Objective:   Physical Exam BP 113/75 mmHg  Pulse 82  Temp(Src) 97.8 F (36.6 C)  Resp 16  Ht 5' 8.5" (1.74 m)  Wt 164 lb (74.39 kg)  BMI 24.57 kg/m2  SpO2 100%   Gen. Well-developed well-nourished male in no apparent distress alert and oriented 3 Lungs no rhonchi or wheezing  left leg with bulging varicosities in the posterior calf up to the popliteal fossa extending medially into the below and above knee area. 1-2+ edema from the knee to the ankle. 3+ dorsalis pedis pulse palpable.  Today I ordered a venous duplex exam of the left leg which I reviewed and interpreted. There is no DVT. The left great saphenous vein is closed with no evidence of severe reflux in areas that have large caliber. The left small saphenous vein however does have gross reflux and is a large caliber vein.  I performed an independent bedside sono site exam confirming the above findings with a patent left small saphenous vein with reflux     Assessment:      painful varicosities left leg with worsening edema due to gross reflux left small saphenous vein   symptoms are resistant to conservative measures including long-leg elastic compression stockings, elevation, and ibuprofen which patient has been utilizing for the last 6 plus months  Plan:      patient needs number-one laser ablation left small saphenous vein and then return in 3 months to see a stab phlebectomy of these painful varicosities will be indicated. We will proceed with precertification form this in the near future

## 2014-11-18 ENCOUNTER — Other Ambulatory Visit: Payer: Self-pay | Admitting: *Deleted

## 2014-11-18 DIAGNOSIS — I83812 Varicose veins of left lower extremities with pain: Secondary | ICD-10-CM

## 2014-12-08 ENCOUNTER — Other Ambulatory Visit: Payer: Self-pay

## 2014-12-08 ENCOUNTER — Other Ambulatory Visit: Payer: Self-pay | Admitting: *Deleted

## 2014-12-08 DIAGNOSIS — I83892 Varicose veins of left lower extremities with other complications: Secondary | ICD-10-CM

## 2014-12-16 ENCOUNTER — Encounter: Payer: Self-pay | Admitting: Vascular Surgery

## 2014-12-21 ENCOUNTER — Encounter: Payer: Self-pay | Admitting: Vascular Surgery

## 2014-12-21 ENCOUNTER — Ambulatory Visit (INDEPENDENT_AMBULATORY_CARE_PROVIDER_SITE_OTHER): Payer: Medicare Other | Admitting: Vascular Surgery

## 2014-12-21 VITALS — BP 128/71 | HR 58 | Temp 97.0°F | Resp 16 | Ht 68.5 in | Wt 165.0 lb

## 2014-12-21 DIAGNOSIS — I83892 Varicose veins of left lower extremities with other complications: Secondary | ICD-10-CM | POA: Diagnosis not present

## 2014-12-21 HISTORY — PX: ENDOVENOUS ABLATION SAPHENOUS VEIN W/ LASER: SUR449

## 2014-12-21 NOTE — Progress Notes (Signed)
Subjective:     Patient ID: Steve Hawkins, male   DOB: [birth year], 74 y.o.   MRN: [id number]  HPI this 74 year old male had laser ablation of the left small saphenous vein from the junction of the mid to lower third of the calf to near the saphenous popliteal junction performed under local tumescent anesthesia. A total of 1107 J of energy was utilized. He tolerated the procedure well.   Review of Systems     Objective:   Physical Exam BP 128/71 mmHg  Pulse 58  Temp(Src) 97 F (36.1 C) (Oral)  Resp 16  Ht 5' 8.5" (1.74 m)  Wt 165 lb (74.844 kg)  BMI 24.72 kg/m2  SpO2 97%       Assessment:     Well-tolerated laser ablation left small saphenous vein performed under local tumescent anesthesia for gross reflux with pain and swelling    Plan:     Return in 1 week for venous duplex exam left leg to confirm closure left small saphenous vein

## 2014-12-21 NOTE — Progress Notes (Signed)
Laser Ablation Procedure    Date: 12/21/2014   Steve Hawkins DOB:1940/06/14  Consent signed: Yes    Surgeon:  Dr. Nelda Severe. Kellie Simmering  Procedure: Laser Ablation: left Small Saphenous Vein  BP 128/71 mmHg  Pulse 58  Temp(Src) 97 F (36.1 C) (Oral)  Resp 16  Ht 5' 8.5" (1.74 m)  Wt 165 lb (74.844 kg)  BMI 24.72 kg/m2  SpO2 97%  Tumescent Anesthesia: 250 cc 0.9% NaCl with 50 cc Lidocaine HCL with 1% Epi and 15 cc 8.4% NaHCO3  Local Anesthesia: 5 cc Lidocaine HCL and NaHCO3 (ratio 2:1)  Pulsed Mode: 15 watts, 574ms delay, 1.0 duration  Total Energy:    1107 Joules          Total Pulses:  74              Total Time: 1 :14   Patient tolerated procedure well    Description of Procedure:  After marking the course of the secondary varicosities, the patient was placed on the operating table in the prone position, and the left leg was prepped and draped in sterile fashion.   Local anesthetic was administered and under ultrasound guidance the saphenous vein was accessed with a micro needle and guide wire; then the mirco puncture sheath was placed.  A guide wire was inserted saphenopopliteal junction , followed by a 5 french sheath.  The position of the sheath and then the laser fiber below the junction was confirmed using the ultrasound.  Tumescent anesthesia was administered along the course of the saphenous vein using ultrasound guidance. The patient was placed in Trendelenburg position and protective laser glasses were placed on patient and staff, and the laser was fired at 15 watts continuous mode advancing 1-9mm/second for a total of 1107 joules.       Steri strips were applied and ABD pads and thigh high compression stockings were applied.  Ace wrap bandages were applied at the top of the saphenopopliteal junction. Blood loss was less than 15 cc.  The patient ambulated out of the operating room having tolerated the procedure well.

## 2014-12-22 ENCOUNTER — Telehealth: Payer: Self-pay | Admitting: *Deleted

## 2014-12-22 NOTE — Telephone Encounter (Signed)
    12/22/2014  Time: 10:41 AM   Patient Name: Steve Hawkins  Patient of: J.D. Kellie Simmering  Procedure:Laser Ablation left small saphenous vein 12-21-2014  Reached patient at home and checked  His status  Yes    Comments/Actions Taken: Mr. Murakami states no left leg pain and no swelling except for his left toes.  Encouraged him to keep left leg elevated when sitting.  Reviewed post procedural instructions with him and reminded him of post LA duplex and VV FU with Dr. Kellie Simmering on 01-04-2015 (due to Mr. Currington's schedule as he is out of town the week of 12-28-2014.)     @SIGNATURE @

## 2014-12-23 DIAGNOSIS — H903 Sensorineural hearing loss, bilateral: Secondary | ICD-10-CM | POA: Diagnosis not present

## 2014-12-23 DIAGNOSIS — H60331 Swimmer's ear, right ear: Secondary | ICD-10-CM | POA: Diagnosis not present

## 2014-12-24 ENCOUNTER — Encounter: Payer: Self-pay | Admitting: Vascular Surgery

## 2014-12-31 ENCOUNTER — Encounter: Payer: Self-pay | Admitting: Vascular Surgery

## 2015-01-04 ENCOUNTER — Ambulatory Visit (HOSPITAL_COMMUNITY)
Admission: RE | Admit: 2015-01-04 | Discharge: 2015-01-04 | Disposition: A | Discharge status: Home / Self Care | Payer: Medicare Other | Source: Ambulatory Visit | Attending: Vascular Surgery | Admitting: Vascular Surgery

## 2015-01-04 ENCOUNTER — Ambulatory Visit (INDEPENDENT_AMBULATORY_CARE_PROVIDER_SITE_OTHER): Payer: Medicare Other | Admitting: Vascular Surgery

## 2015-01-04 ENCOUNTER — Encounter: Payer: Self-pay | Admitting: Vascular Surgery

## 2015-01-04 VITALS — BP 126/80 | HR 72 | Temp 97.7°F | Resp 16 | Ht 68.5 in | Wt 165.0 lb

## 2015-01-04 DIAGNOSIS — I83892 Varicose veins of left lower extremities with other complications: Secondary | ICD-10-CM | POA: Diagnosis not present

## 2015-01-04 DIAGNOSIS — I83812 Varicose veins of left lower extremities with pain: Secondary | ICD-10-CM | POA: Insufficient documentation

## 2015-01-04 NOTE — Progress Notes (Signed)
Subjective:     Patient ID: Steve Hawkins, male   DOB: [birth year], 74 y.o.   MRN: [id number]  HPI this 74 year old male returns 2 weeks post laser ablation left small saphenous vein for gross reflux with painful varicosities. He's had no pain at the ablation site. He does have less edema in the ankle he states. He did take ibuprofen as instructed and discontinued this 1 week ago. He has worn his elastic compression stockings-long-leg-20-30 as instructed.  Past Medical History  Diagnosis Date  . Varicose veins     Social History  Substance Use Topics  . Smoking status: Former Research scientist (life sciences)  . Smokeless tobacco: Never Used  . Alcohol Use: 2.4 oz/week    1 Cans of beer, 1 Shots of liquor, 2 Standard drinks or equivalent per week    No family history on file.  Allergies  Allergen Reactions  . Bee Venom Anaphylaxis     Current outpatient prescriptions:  .  doxycycline (VIBRAMYCIN) 100 MG capsule, Take 1 capsule by mouth 2 (two) times daily., Disp: , Rfl:  .  finasteride (PROSCAR) 5 MG tablet, Take 1 tablet by mouth daily., Disp: , Rfl:  .  sildenafil (VIAGRA) 100 MG tablet, Take 100 mg by mouth daily as needed for erectile dysfunction. , Disp: , Rfl:  .  tamsulosin (FLOMAX) 0.4 MG CAPS capsule, Take 1 capsule by mouth daily., Disp: , Rfl:   Filed Vitals:   01/04/15 1355  BP: 126/80  Pulse: 72  Temp: 97.7 F (36.5 C)  Resp: 16  Height: 5' 8.5" (1.74 m)  Weight: 165 lb (74.844 kg)  SpO2: 98%    Body mass index is 24.72 kg/(m^2).           Review of Systems denies chest pain, dyspnea on exertion, PND, orthopnea, claudication, hemoptysis     Objective:   Physical Exam BP 126/80 mmHg  Pulse 72  Temp(Src) 97.7 F (36.5 C)  Resp 16  Ht 5' 8.5" (1.74 m)  Wt 165 lb (74.844 kg)  BMI 24.72 kg/m2  SpO2 98%  Gen. well-developed well-nourished male in no apparent distress alert and oriented 3 Lungs no rhonchi or wheezing Left leg with mild discomfort to palpation over  popliteal fossa. 3+ dorsalis pedis pulse palpable. Varicosities still notable in the calf area but less tense than previously. No active ulcer noted.  Today I ordered a venous duplex exam the left leg which I reviewed and interpreted. There is no DVT. There is total closure of the left small saphenous vein up to near the saphenous popliteal junction.     Assessment:     Successful laser ablation left small saphenous vein for gross reflux with painful varicosities in the left posterior calf    Plan:     Patient will return in 3 months to determine if stab phlebectomy of residual painful varicosities will be indicated.

## 2015-01-06 DIAGNOSIS — H60331 Swimmer's ear, right ear: Secondary | ICD-10-CM | POA: Diagnosis not present

## 2015-01-11 DIAGNOSIS — H60331 Swimmer's ear, right ear: Secondary | ICD-10-CM | POA: Diagnosis not present

## 2015-02-02 DIAGNOSIS — Z23 Encounter for immunization: Secondary | ICD-10-CM | POA: Diagnosis not present

## 2015-02-16 ENCOUNTER — Ambulatory Visit (INDEPENDENT_AMBULATORY_CARE_PROVIDER_SITE_OTHER): Payer: Medicare Other | Admitting: Urology

## 2015-02-16 DIAGNOSIS — N401 Enlarged prostate with lower urinary tract symptoms: Secondary | ICD-10-CM | POA: Diagnosis not present

## 2015-02-16 DIAGNOSIS — R972 Elevated prostate specific antigen [PSA]: Secondary | ICD-10-CM | POA: Diagnosis not present

## 2015-02-16 DIAGNOSIS — N5201 Erectile dysfunction due to arterial insufficiency: Secondary | ICD-10-CM

## 2015-03-09 ENCOUNTER — Ambulatory Visit (INDEPENDENT_AMBULATORY_CARE_PROVIDER_SITE_OTHER): Payer: Medicare Other | Admitting: Urology

## 2015-03-09 DIAGNOSIS — N401 Enlarged prostate with lower urinary tract symptoms: Secondary | ICD-10-CM | POA: Diagnosis not present

## 2015-03-09 DIAGNOSIS — N521 Erectile dysfunction due to diseases classified elsewhere: Secondary | ICD-10-CM

## 2015-03-28 IMAGING — CT CT CHEST W/O CM
2 of 3 series · 15 of 36 positions shown, 18 images · non-contrast
Comparison: No priors.

CLINICAL DATA: Cough.  Prior history of smoking.

CT CHEST WITHOUT CONTRAST
TECHNIQUE: Multidetector CT imaging of the chest was performed
following the standard protocol without IV contrast.

[Series 2: chestroutine 5.0 b40f · axial · 0.66mm/px · z∈[-348,-58]mm · 12 of 68 slices shown, 15 images]
[im 5/68  mediastinal]
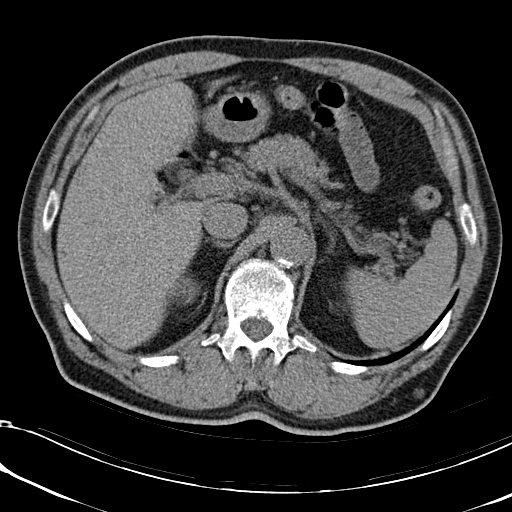
[im 5/68  lung]
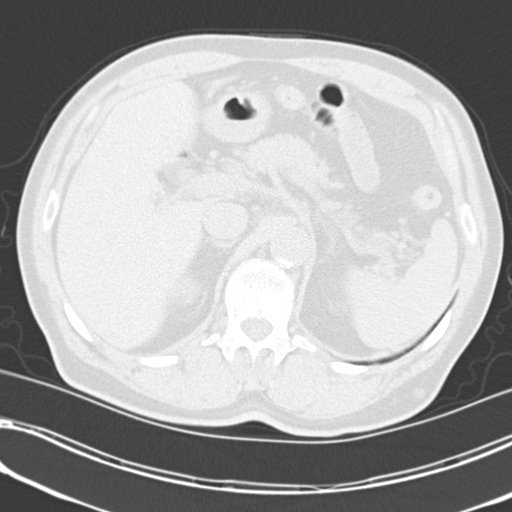
[im 10/68  lung]
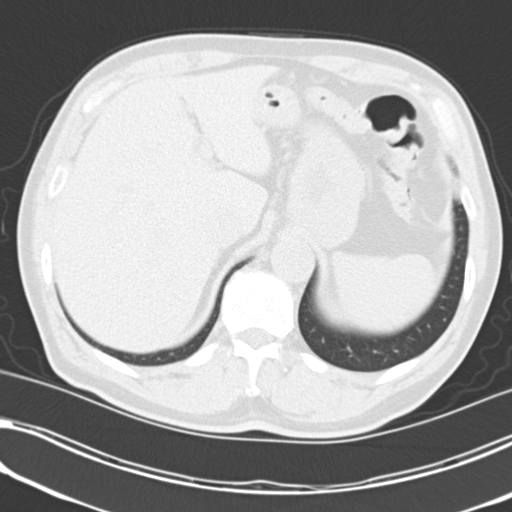
[im 15/68  lung]
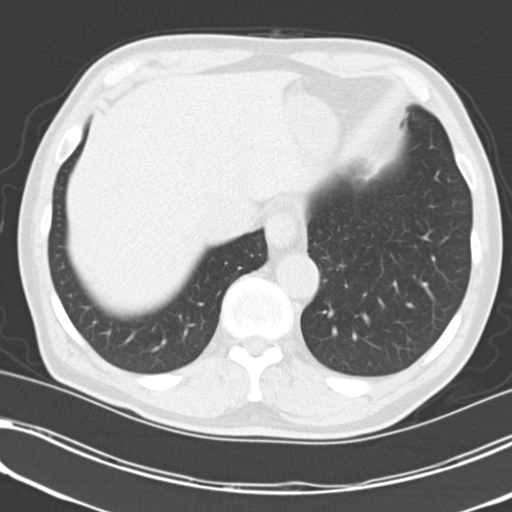
[im 20/68  lung]
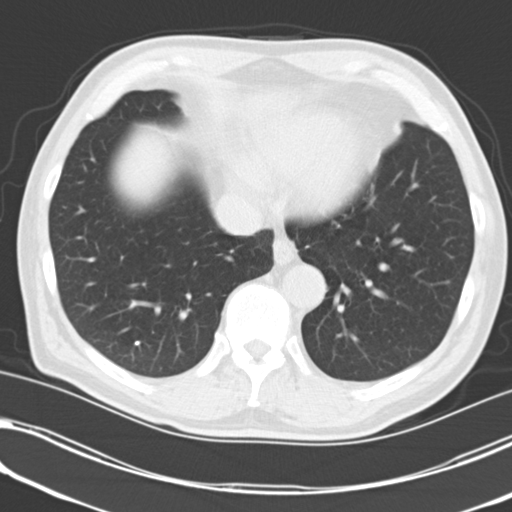
[im 25/68  mediastinal]
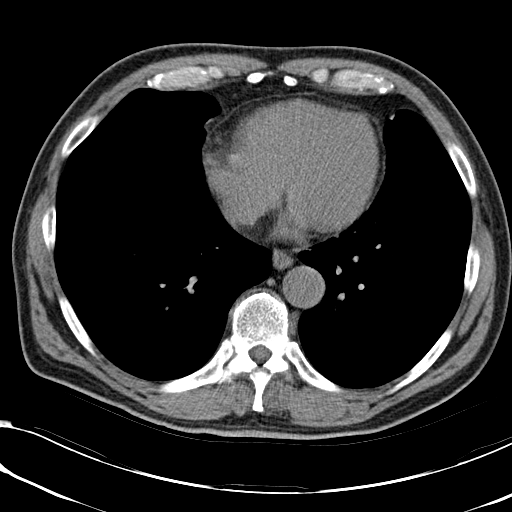
[im 25/68  lung]
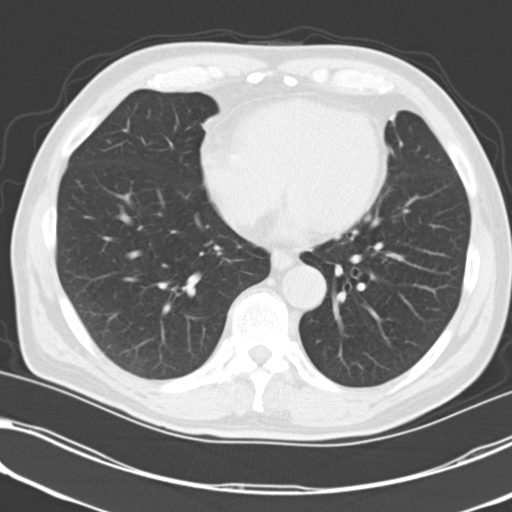
[im 30/68  lung]
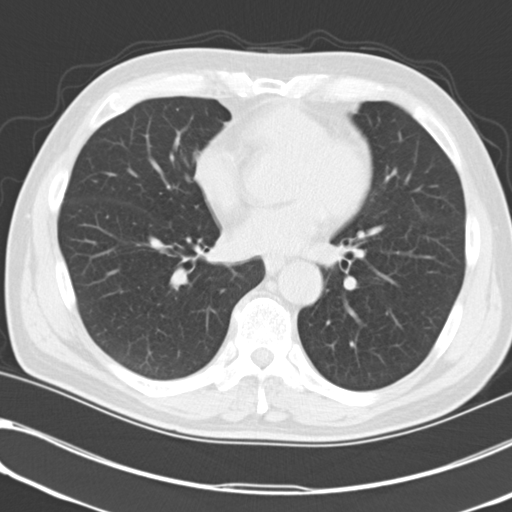
[im 38/68  lung]
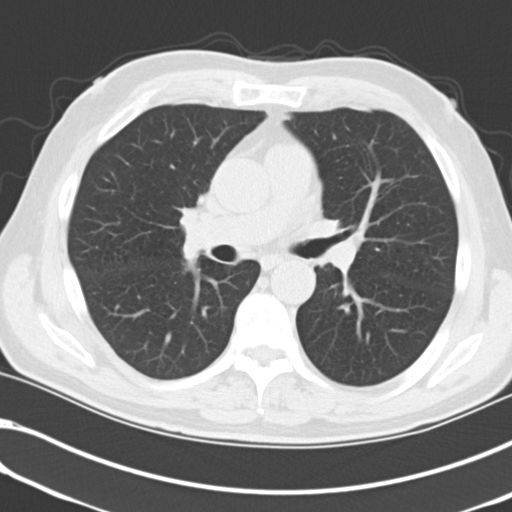
[im 43/68  lung]
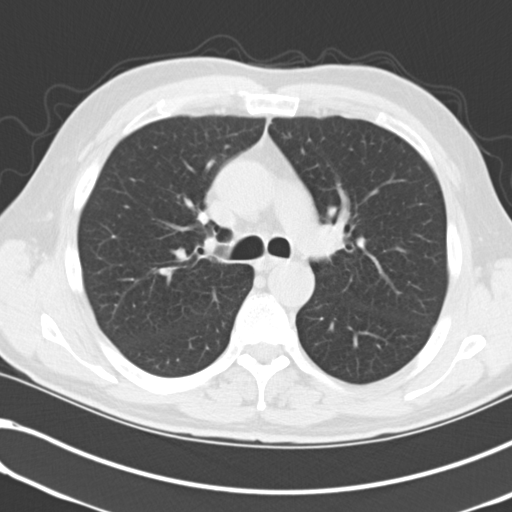
[im 48/68  mediastinal]
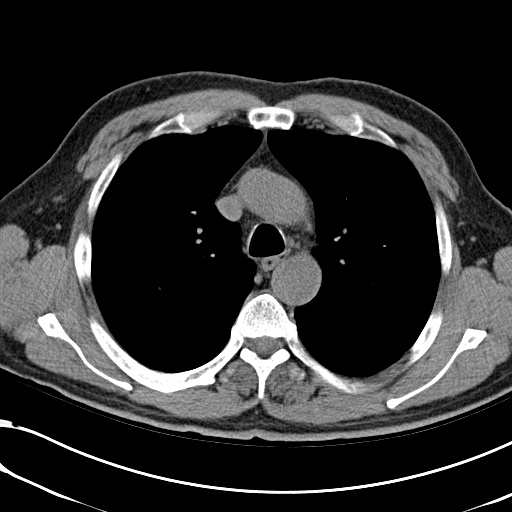
[im 48/68  lung]
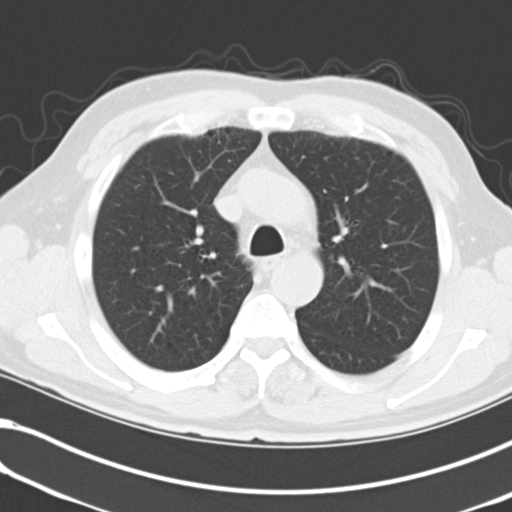
[im 53/68  lung]
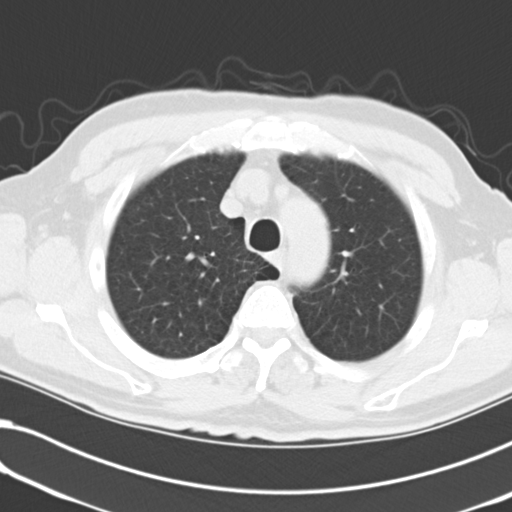
[im 58/68  lung]
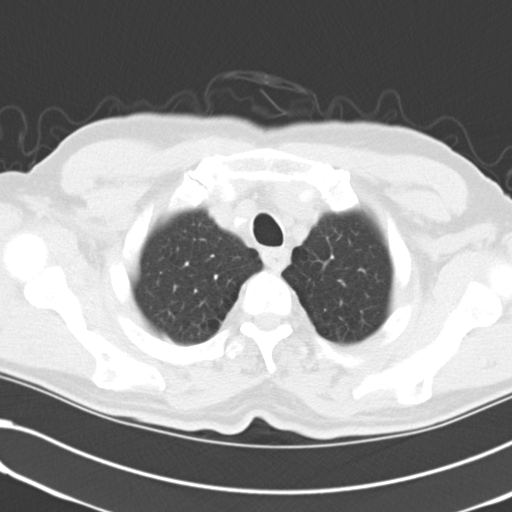
[im 63/68  lung]
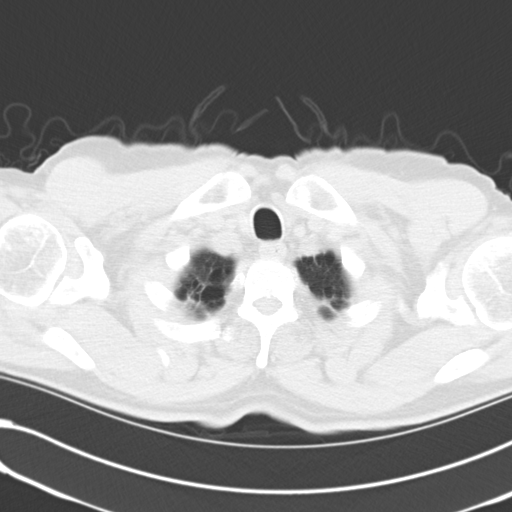

[Series 4: mpr coro 3mm · coronal · 0.64mm/px · 3 of 87 slices shown]
[im 18/87  lung]
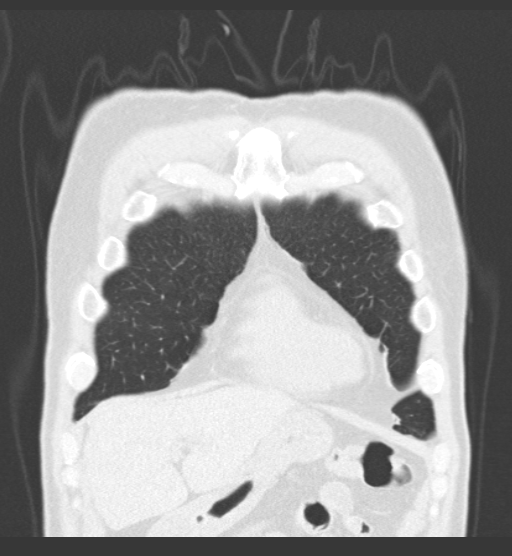
[im 35/87  lung]
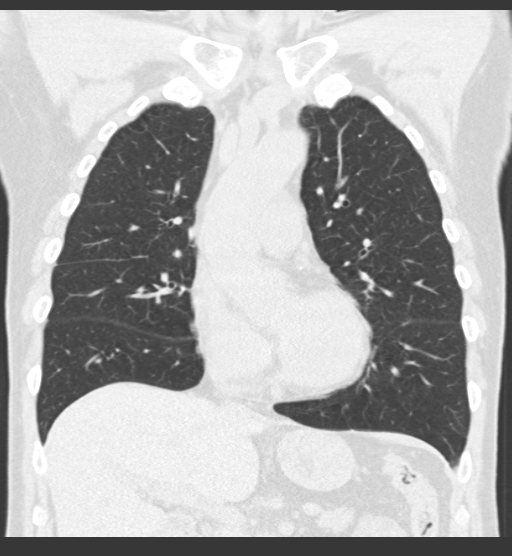
[im 52/87  lung]
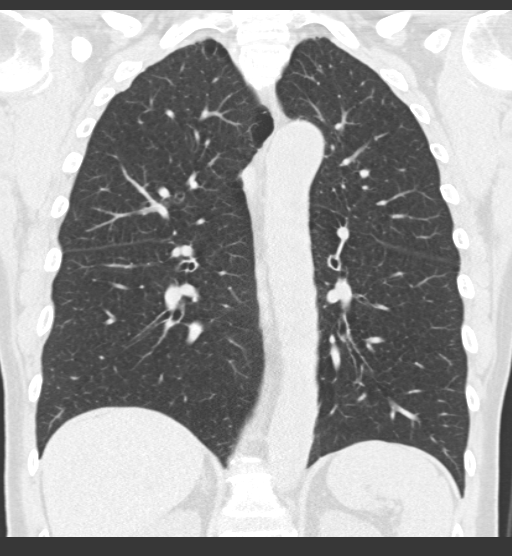

[15 of 36 positions shown; findings below may reference images not displayed]

FINDINGS: Mediastinum: Heart size is normal. There is no significant
pericardial fluid, thickening or pericardial calcification. There
is atherosclerosis of the thoracic aorta, the great vessels of the
mediastinum and the coronary arteries, including calcified
atherosclerotic plaque in the left anterior descending coronary
artery. No pathologically enlarged mediastinal or hilar lymph
nodes. Please note that accurate exclusion of hilar adenopathy is
limited on noncontrast CT scans.  Esophagus is unremarkable in
appearance.

Lungs/Pleura: No acute consolidative airspace disease.  No pleural
effusions.  In the right middle lobe there is an ovoid shaped 8 x 6
mm nodule associated with the minor fissure (image 33 of series 3).
No other larger more suspicious appearing pulmonary nodules or
masses are otherwise noted.  Mild centrilobular and paraseptal
emphysema.

Upper Abdomen: Unremarkable.

Musculoskeletal: There are no aggressive appearing lytic or blastic
lesions noted in the visualized portions of the skeleton.
IMPRESSION: 1.  6 x 8 mm nodule in the right middle lobe associated with the
minor fissure.  This is favored to represent a subpleural lymph
node, but is nonspecific in appearance.  Given the smoking related
changes in the lungs, the patient is at increased risk for
bronchogenic carcinoma, and accordingly a follow-up chest CT at 3-6
months is recommended.   This recommendation follows the consensus
statement: Guidelines for Management of Small Pulmonary Nodules
Detected on CT Scans: A Statement from the [HOSPITAL] as
published in Radiology 1111; [DATE].
2.  Atherosclerosis, including left anterior descending coronary
artery disease. Assessment for potential risk factor modification,
dietary therapy or pharmacologic therapy may be warranted, if
clinically indicated.

## 2015-03-31 ENCOUNTER — Encounter: Payer: Self-pay | Admitting: Vascular Surgery

## 2015-04-06 ENCOUNTER — Ambulatory Visit (INDEPENDENT_AMBULATORY_CARE_PROVIDER_SITE_OTHER): Payer: Medicare Other | Admitting: Vascular Surgery

## 2015-04-06 ENCOUNTER — Encounter: Payer: Self-pay | Admitting: Vascular Surgery

## 2015-04-06 VITALS — BP 121/77 | HR 76 | Temp 97.6°F | Resp 16 | Ht 68.5 in | Wt 162.0 lb

## 2015-04-06 DIAGNOSIS — I83892 Varicose veins of left lower extremities with other complications: Secondary | ICD-10-CM | POA: Diagnosis not present

## 2015-04-06 NOTE — Progress Notes (Signed)
Subjective:     Patient ID: Steve Hawkins, male   DOB: [birth year], 75 y.o.   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 Swelling in his ankle has improved but he continues to have aching throbbing burning discomfort involving varicosities in his posterior calf and medial calf. He does continue to have some swelling. He has one silastic compression stocking with no improvement in the symptoms. He has no history of DVT.  Past Medical History  Diagnosis Date  . Varicose veins     Social History  Substance Use Topics  . Smoking status: Former Research scientist (life sciences)  . Smokeless tobacco: Never Used  . Alcohol Use: 2.4 oz/week    1 Cans of beer, 1 Shots of liquor, 2 Standard drinks or equivalent per week    History reviewed. No pertinent family history.  Allergies  Allergen Reactions  . Bee Venom Anaphylaxis     Current outpatient prescriptions:  .  doxycycline (VIBRAMYCIN) 100 MG capsule, Take 1 capsule by mouth 2 (two) times daily., Disp: , Rfl:  .  finasteride (PROSCAR) 5 MG tablet, Take 1 tablet by mouth daily., Disp: , Rfl:  .  sildenafil (VIAGRA) 100 MG tablet, Take 100 mg by mouth daily as needed for erectile dysfunction. , Disp: , Rfl:  .  tamsulosin (FLOMAX) 0.4 MG CAPS capsule, Take 1 capsule by mouth daily., Disp: , Rfl:   Filed Vitals:   04/06/15 1544  BP: 121/77  Pulse: 76  Temp: 97.6 F (36.4 C)  Resp: 16  Height: 5' 8.5" (1.74 m)  Weight: 162 lb (73.483 kg)  SpO2: 98%    Body mass index is 24.27 kg/(m^2).           Review of Systems  Denies chest pain, dyspnea on exertion, PND, orthopnea, hemoptysis     Objective:   Physical Exam BP 121/77 mmHg  Pulse 76  Temp(Src) 97.6 F (36.4 C)  Resp 16  Ht 5' 8.5" (1.74 m)  Wt 162 lb (73.483 kg)  BMI 24.27 kg/m2  SpO2 98%   general alert and oriented 3 in no apparent distress Lungs no rhonchi or wheezing  left leg with bulging varicosities and posterior calf extending medially and down into the medial malleolar area and on to the dorsum of the foot.     Assessment:      status post laser ablation left small saphenous vein with  Improvement in distal edema but persistent pain in bulging varicosities     Plan:      plan multiple stab phlebectomy of painful varicosities  To hopefully relieve his symptoms  We'll schedule that in the near future

## 2015-04-12 ENCOUNTER — Ambulatory Visit (INDEPENDENT_AMBULATORY_CARE_PROVIDER_SITE_OTHER): Payer: Medicare Other | Admitting: Vascular Surgery

## 2015-04-12 ENCOUNTER — Encounter: Payer: Self-pay | Admitting: Vascular Surgery

## 2015-04-12 VITALS — BP 127/76 | HR 59 | Temp 98.0°F | Resp 16 | Ht 68.5 in | Wt 162.0 lb

## 2015-04-12 DIAGNOSIS — I83892 Varicose veins of left lower extremities with other complications: Secondary | ICD-10-CM

## 2015-04-12 NOTE — Progress Notes (Signed)
    Stab Phlebectomy Procedure  Steve Hawkins DOB:1940/10/14  04/12/2015  Consent signed: Yes  Surgeon:J.D. Kellie Simmering  Procedure: stab phlebectomy: left leg  BP 127/76 mmHg  Pulse 59  Temp(Src) 98 F (36.7 C)  Resp 16  Ht 5' 8.5" (1.74 m)  Wt 162 lb (73.483 kg)  BMI 24.27 kg/m2  SpO2 98%  Start time: 2:50 pm   End time: 3:40 pm   Tumescent Anesthesia: 200 cc 0.9% NaCl with 50 cc Lidocaine HCL with 1% Epi and 15 cc 8.4% NaHCO3  Local Anesthesia: 4 cc Lidocaine HCL and NaHCO3 (ratio 2:1)    Stab Phlebectomy: >20 Sites: Thigh and Calf  Patient tolerated procedure well: Yes  Notes:   Description of Procedure:  After marking the course of the secondary varicosities, the patient was placed on the operating table in the prone position, and the left leg was prepped and draped in sterile fashion.    The patient was then put into Trendelenburg position.  Local anesthetic was administered at the previously marked varicosities, and tumescent anesthesia was administered around the vessels.  Greater than 20 stab wounds were made using the tip of an 11 blade. And using the vein hook, the phlebectomies were performed using a hemostat to avulse the varicosities.  Adequate hemostasis was achieved, and steri strips were applied to the stab wound.      ABD pads and thigh high compression stockings were applied as well ace wraps where needed. Blood loss was less than 15 cc.  The patient ambulated out of the operating room having tolerated the procedure well.

## 2015-04-12 NOTE — Progress Notes (Signed)
Subjective:     Patient ID: Steve Hawkins, male   DOB: [birth year], 75 y.o.   MRN: [id number]  HPI This 75 year old male had multiple stab phlebectomy -greater than 20- of painful varicosities in the left calf performed under local tumescent anesthesia. He tolerated the procedure well   Review of Systems     Objective:   Physical Exam BP 127/76 mmHg  Pulse 59  Temp(Src) 98 F (36.7 C)  Resp 16  Ht 5' 8.5" (1.74 m)  Wt 162 lb (73.483 kg)  BMI 24.27 kg/m2  SpO2 98%       Assessment:      well tolerated multiple stab phlebectomy left leg performed under local tumescent anesthesia -greater than 20     Plan:      return in 3 months for final follow-up

## 2015-04-13 ENCOUNTER — Encounter: Payer: Self-pay | Admitting: Vascular Surgery

## 2015-04-13 ENCOUNTER — Telehealth: Payer: Self-pay | Admitting: *Deleted

## 2015-04-13 NOTE — Telephone Encounter (Signed)
Pt doing well. No bleeding from stab sites. Told him it was ok to loosen the ace wraps.

## 2015-04-26 ENCOUNTER — Other Ambulatory Visit: Payer: Medicare Other | Admitting: Vascular Surgery

## 2015-05-28 DIAGNOSIS — Z79899 Other long term (current) drug therapy: Secondary | ICD-10-CM | POA: Diagnosis not present

## 2015-05-28 DIAGNOSIS — Z125 Encounter for screening for malignant neoplasm of prostate: Secondary | ICD-10-CM | POA: Diagnosis not present

## 2015-05-28 DIAGNOSIS — N529 Male erectile dysfunction, unspecified: Secondary | ICD-10-CM | POA: Diagnosis not present

## 2015-05-28 DIAGNOSIS — N400 Enlarged prostate without lower urinary tract symptoms: Secondary | ICD-10-CM | POA: Diagnosis not present

## 2015-06-01 DIAGNOSIS — H10501 Unspecified blepharoconjunctivitis, right eye: Secondary | ICD-10-CM | POA: Diagnosis not present

## 2015-06-03 ENCOUNTER — Other Ambulatory Visit (HOSPITAL_COMMUNITY): Payer: Self-pay | Admitting: Internal Medicine

## 2015-06-03 DIAGNOSIS — R911 Solitary pulmonary nodule: Secondary | ICD-10-CM

## 2015-06-04 DIAGNOSIS — N3000 Acute cystitis without hematuria: Secondary | ICD-10-CM | POA: Diagnosis not present

## 2015-06-04 DIAGNOSIS — N400 Enlarged prostate without lower urinary tract symptoms: Secondary | ICD-10-CM | POA: Diagnosis not present

## 2015-06-04 DIAGNOSIS — Z6826 Body mass index (BMI) 26.0-26.9, adult: Secondary | ICD-10-CM | POA: Diagnosis not present

## 2015-06-04 DIAGNOSIS — R911 Solitary pulmonary nodule: Secondary | ICD-10-CM | POA: Diagnosis not present

## 2015-06-04 DIAGNOSIS — R001 Bradycardia, unspecified: Secondary | ICD-10-CM | POA: Diagnosis not present

## 2015-06-28 ENCOUNTER — Ambulatory Visit (HOSPITAL_COMMUNITY)
Admission: RE | Admit: 2015-06-28 | Discharge: 2015-06-28 | Disposition: A | Discharge status: Home / Self Care | Payer: Medicare Other | Source: Ambulatory Visit | Attending: Internal Medicine | Admitting: Internal Medicine

## 2015-06-28 DIAGNOSIS — J439 Emphysema, unspecified: Secondary | ICD-10-CM | POA: Insufficient documentation

## 2015-06-28 DIAGNOSIS — I2510 Atherosclerotic heart disease of native coronary artery without angina pectoris: Secondary | ICD-10-CM | POA: Insufficient documentation

## 2015-06-28 DIAGNOSIS — R911 Solitary pulmonary nodule: Secondary | ICD-10-CM

## 2015-06-28 DIAGNOSIS — R918 Other nonspecific abnormal finding of lung field: Secondary | ICD-10-CM | POA: Diagnosis not present

## 2015-07-06 DIAGNOSIS — H25013 Cortical age-related cataract, bilateral: Secondary | ICD-10-CM | POA: Diagnosis not present

## 2015-07-06 DIAGNOSIS — H2513 Age-related nuclear cataract, bilateral: Secondary | ICD-10-CM | POA: Diagnosis not present

## 2015-07-13 ENCOUNTER — Ambulatory Visit: Payer: Medicare Other | Admitting: Vascular Surgery

## 2015-07-21 ENCOUNTER — Encounter: Payer: Self-pay | Admitting: Vascular Surgery

## 2015-07-27 ENCOUNTER — Ambulatory Visit (INDEPENDENT_AMBULATORY_CARE_PROVIDER_SITE_OTHER): Payer: Medicare Other | Admitting: Vascular Surgery

## 2015-07-27 ENCOUNTER — Encounter: Payer: Self-pay | Admitting: Vascular Surgery

## 2015-07-27 VITALS — BP 127/67 | HR 72 | Temp 97.6°F | Resp 16 | Ht 68.5 in | Wt 165.0 lb

## 2015-07-27 DIAGNOSIS — I83892 Varicose veins of left lower extremities with other complications: Secondary | ICD-10-CM | POA: Diagnosis not present

## 2015-07-27 NOTE — Progress Notes (Signed)
Subjective:     Patient ID: Steve Hawkins, male   DOB: 1941/02/08, 75 y.o.   MRN: [id number]  HPI this 75 year old male returns 3 months post-stab phlebectomy of painful varicosities left leg. He had previously undergone laser ablation of the left small saphenous vein for gross reflux and has also had laser ablation of the left great saphenous vein. He does have some persistent edema in the left leg but the painful varicosities have been eliminated. He is not wearing elastic compression stockings on a regular basis. He was recently on a flight and spent 12 days in [place] and states that the edema was slightly worse on his trip. He did wear stockings on the flight. He's had no history of DVT thrombophlebitis stasis ulcers or bleeding.  Past Medical History  Diagnosis Date  . Varicose veins     Social History  Substance Use Topics  . Smoking status: Former Research scientist (life sciences)  . Smokeless tobacco: Never Used  . Alcohol Use: 2.4 oz/week    1 Cans of beer, 1 Shots of liquor, 2 Standard drinks or equivalent per week    History reviewed. No pertinent family history.  Allergies  Allergen Reactions  . Bee Venom Anaphylaxis     Current outpatient prescriptions:  .  doxycycline (VIBRAMYCIN) 100 MG capsule, Take 1 capsule by mouth 2 (two) times daily., Disp: , Rfl:  .  finasteride (PROSCAR) 5 MG tablet, Take 1 tablet by mouth daily., Disp: , Rfl:  .  sildenafil (VIAGRA) 100 MG tablet, Take 100 mg by mouth daily as needed for erectile dysfunction. , Disp: , Rfl:  .  tamsulosin (FLOMAX) 0.4 MG CAPS capsule, Take 1 capsule by mouth daily., Disp: , Rfl:   Filed Vitals:   07/27/15 1308  BP: 127/67  Pulse: 72  Temp: 97.6 F (36.4 C)  Resp: 16  Height: 5' 8.5" (1.74 m)  Weight: 165 lb (74.844 kg)  SpO2: 100%    Body mass index is 24.72 kg/(m^2).           Review of Systems denies chest pain, dyspnea on exertion, PND, orthopnea, hemoptysis    Objective:   Physical Exam BP 127/67 mmHg   Pulse 72  Temp(Src) 97.6 F (36.4 C)  Resp 16  Ht 5' 8.5" (1.74 m)  Wt 165 lb (74.844 kg)  BMI 24.72 kg/m2  SpO2 100%  Gen. well-developed well-nourished male no apparent distress alert and oriented 3 Lungs no rhonchi or wheezing Cardiovascular regular rhythm no murmurs Left leg with 1+ edema from knee to ankle including the foot. 3+ dorsalis pedis pulse palpable. Well-healed stab phlebectomy sites. Right leg free of distal edema and bulging varicosities I reviewed the previous duplex scan which revealed total closure of the left small saphenous vein up to near the saphenous popliteal junction with no DVT     Assessment:     Successful laser ablation left small and great saphenous veins with multiple stab phlebectomy of painful varicosities Persistent edema-1+ left leg    Plan:     Have recommended elevate foot of bed 3 inches Short leg elastic compression stockings to be put on first thing  in the morning Return to see me on when necessary basis

## 2015-07-28 DIAGNOSIS — H25011 Cortical age-related cataract, right eye: Secondary | ICD-10-CM | POA: Diagnosis not present

## 2015-07-28 DIAGNOSIS — H2511 Age-related nuclear cataract, right eye: Secondary | ICD-10-CM | POA: Diagnosis not present

## 2015-09-08 DIAGNOSIS — H21561 Pupillary abnormality, right eye: Secondary | ICD-10-CM | POA: Diagnosis not present

## 2015-09-08 DIAGNOSIS — H2511 Age-related nuclear cataract, right eye: Secondary | ICD-10-CM | POA: Diagnosis not present

## 2015-09-08 DIAGNOSIS — H25012 Cortical age-related cataract, left eye: Secondary | ICD-10-CM | POA: Diagnosis not present

## 2015-09-08 DIAGNOSIS — H2512 Age-related nuclear cataract, left eye: Secondary | ICD-10-CM | POA: Diagnosis not present

## 2015-09-08 DIAGNOSIS — H25011 Cortical age-related cataract, right eye: Secondary | ICD-10-CM | POA: Diagnosis not present

## 2015-09-15 DIAGNOSIS — H25012 Cortical age-related cataract, left eye: Secondary | ICD-10-CM | POA: Diagnosis not present

## 2015-09-15 DIAGNOSIS — H2512 Age-related nuclear cataract, left eye: Secondary | ICD-10-CM | POA: Diagnosis not present

## 2015-09-30 DIAGNOSIS — X32XXXD Exposure to sunlight, subsequent encounter: Secondary | ICD-10-CM | POA: Diagnosis not present

## 2015-09-30 DIAGNOSIS — L02224 Furuncle of groin: Secondary | ICD-10-CM | POA: Diagnosis not present

## 2015-09-30 DIAGNOSIS — D225 Melanocytic nevi of trunk: Secondary | ICD-10-CM | POA: Diagnosis not present

## 2015-09-30 DIAGNOSIS — Z1283 Encounter for screening for malignant neoplasm of skin: Secondary | ICD-10-CM | POA: Diagnosis not present

## 2015-09-30 DIAGNOSIS — B9689 Other specified bacterial agents as the cause of diseases classified elsewhere: Secondary | ICD-10-CM | POA: Diagnosis not present

## 2015-09-30 DIAGNOSIS — L218 Other seborrheic dermatitis: Secondary | ICD-10-CM | POA: Diagnosis not present

## 2015-09-30 DIAGNOSIS — L570 Actinic keratosis: Secondary | ICD-10-CM | POA: Diagnosis not present

## 2015-11-15 DIAGNOSIS — R05 Cough: Secondary | ICD-10-CM | POA: Diagnosis not present

## 2015-11-15 DIAGNOSIS — L739 Follicular disorder, unspecified: Secondary | ICD-10-CM | POA: Diagnosis not present

## 2015-12-09 DIAGNOSIS — B081 Molluscum contagiosum: Secondary | ICD-10-CM | POA: Diagnosis not present

## 2016-01-13 ENCOUNTER — Ambulatory Visit (INDEPENDENT_AMBULATORY_CARE_PROVIDER_SITE_OTHER): Payer: Medicare Other | Admitting: Otolaryngology

## 2016-01-13 DIAGNOSIS — H903 Sensorineural hearing loss, bilateral: Secondary | ICD-10-CM

## 2016-01-13 DIAGNOSIS — H608X1 Other otitis externa, right ear: Secondary | ICD-10-CM

## 2016-01-14 ENCOUNTER — Ambulatory Visit (HOSPITAL_COMMUNITY)
Admission: RE | Admit: 2016-01-14 | Discharge: 2016-01-14 | Disposition: A | Discharge status: Home / Self Care | Payer: Medicare Other | Source: Ambulatory Visit | Attending: Internal Medicine | Admitting: Internal Medicine

## 2016-01-14 ENCOUNTER — Other Ambulatory Visit (HOSPITAL_COMMUNITY): Payer: Self-pay | Admitting: Internal Medicine

## 2016-01-14 DIAGNOSIS — S2242XA Multiple fractures of ribs, left side, initial encounter for closed fracture: Secondary | ICD-10-CM | POA: Diagnosis not present

## 2016-01-14 DIAGNOSIS — W19XXXA Unspecified fall, initial encounter: Secondary | ICD-10-CM | POA: Diagnosis not present

## 2016-01-14 DIAGNOSIS — J984 Other disorders of lung: Secondary | ICD-10-CM | POA: Insufficient documentation

## 2016-01-14 DIAGNOSIS — R0781 Pleurodynia: Secondary | ICD-10-CM | POA: Insufficient documentation

## 2016-01-17 ENCOUNTER — Other Ambulatory Visit (HOSPITAL_COMMUNITY): Payer: Self-pay | Admitting: Internal Medicine

## 2016-01-17 ENCOUNTER — Ambulatory Visit (HOSPITAL_COMMUNITY)
Admission: RE | Admit: 2016-01-17 | Discharge: 2016-01-17 | Disposition: A | Discharge status: Home / Self Care | Payer: Medicare Other | Source: Ambulatory Visit | Attending: Internal Medicine | Admitting: Internal Medicine

## 2016-01-17 DIAGNOSIS — R918 Other nonspecific abnormal finding of lung field: Secondary | ICD-10-CM | POA: Insufficient documentation

## 2016-01-17 DIAGNOSIS — R938 Abnormal findings on diagnostic imaging of other specified body structures: Secondary | ICD-10-CM | POA: Insufficient documentation

## 2016-01-17 DIAGNOSIS — R9389 Abnormal findings on diagnostic imaging of other specified body structures: Secondary | ICD-10-CM

## 2016-02-01 DIAGNOSIS — Z961 Presence of intraocular lens: Secondary | ICD-10-CM | POA: Diagnosis not present

## 2016-02-07 DIAGNOSIS — Z23 Encounter for immunization: Secondary | ICD-10-CM | POA: Diagnosis not present

## 2016-02-15 ENCOUNTER — Ambulatory Visit (INDEPENDENT_AMBULATORY_CARE_PROVIDER_SITE_OTHER): Payer: Medicare Other | Admitting: Urology

## 2016-02-15 DIAGNOSIS — R972 Elevated prostate specific antigen [PSA]: Secondary | ICD-10-CM | POA: Diagnosis not present

## 2016-02-15 DIAGNOSIS — N401 Enlarged prostate with lower urinary tract symptoms: Secondary | ICD-10-CM

## 2016-02-17 DIAGNOSIS — B081 Molluscum contagiosum: Secondary | ICD-10-CM | POA: Diagnosis not present

## 2016-06-09 DIAGNOSIS — Z79899 Other long term (current) drug therapy: Secondary | ICD-10-CM | POA: Diagnosis not present

## 2016-06-09 DIAGNOSIS — R001 Bradycardia, unspecified: Secondary | ICD-10-CM | POA: Diagnosis not present

## 2016-06-09 DIAGNOSIS — N529 Male erectile dysfunction, unspecified: Secondary | ICD-10-CM | POA: Diagnosis not present

## 2016-06-16 DIAGNOSIS — Z6825 Body mass index (BMI) 25.0-25.9, adult: Secondary | ICD-10-CM | POA: Diagnosis not present

## 2016-06-16 DIAGNOSIS — R001 Bradycardia, unspecified: Secondary | ICD-10-CM | POA: Diagnosis not present

## 2016-06-16 DIAGNOSIS — M25552 Pain in left hip: Secondary | ICD-10-CM | POA: Diagnosis not present

## 2016-06-16 DIAGNOSIS — N401 Enlarged prostate with lower urinary tract symptoms: Secondary | ICD-10-CM | POA: Diagnosis not present

## 2016-06-21 ENCOUNTER — Ambulatory Visit (HOSPITAL_COMMUNITY)
Admission: RE | Admit: 2016-06-21 | Discharge: 2016-06-21 | Disposition: A | Discharge status: Home / Self Care | Payer: Medicare Other | Source: Ambulatory Visit | Attending: Internal Medicine | Admitting: Internal Medicine

## 2016-06-21 ENCOUNTER — Other Ambulatory Visit (HOSPITAL_COMMUNITY): Payer: Self-pay | Admitting: Internal Medicine

## 2016-06-21 DIAGNOSIS — M160 Bilateral primary osteoarthritis of hip: Secondary | ICD-10-CM | POA: Insufficient documentation

## 2016-06-21 DIAGNOSIS — M25552 Pain in left hip: Secondary | ICD-10-CM

## 2016-07-28 ENCOUNTER — Ambulatory Visit (HOSPITAL_COMMUNITY)
Admission: RE | Admit: 2016-07-28 | Discharge: 2016-07-28 | Disposition: A | Discharge status: Home / Self Care | Payer: Medicare Other | Source: Ambulatory Visit | Attending: Internal Medicine | Admitting: Internal Medicine

## 2016-07-28 ENCOUNTER — Other Ambulatory Visit (HOSPITAL_COMMUNITY): Payer: Self-pay | Admitting: Internal Medicine

## 2016-07-28 DIAGNOSIS — W19XXXA Unspecified fall, initial encounter: Secondary | ICD-10-CM

## 2016-07-28 DIAGNOSIS — M25511 Pain in right shoulder: Secondary | ICD-10-CM

## 2016-07-28 DIAGNOSIS — M79621 Pain in right upper arm: Secondary | ICD-10-CM

## 2016-07-28 DIAGNOSIS — M898X2 Other specified disorders of bone, upper arm: Secondary | ICD-10-CM

## 2016-07-28 DIAGNOSIS — S4991XA Unspecified injury of right shoulder and upper arm, initial encounter: Secondary | ICD-10-CM | POA: Diagnosis not present

## 2016-08-24 DIAGNOSIS — L308 Other specified dermatitis: Secondary | ICD-10-CM | POA: Diagnosis not present

## 2016-08-24 DIAGNOSIS — X32XXXD Exposure to sunlight, subsequent encounter: Secondary | ICD-10-CM | POA: Diagnosis not present

## 2016-08-24 DIAGNOSIS — L253 Unspecified contact dermatitis due to other chemical products: Secondary | ICD-10-CM | POA: Diagnosis not present

## 2016-08-24 DIAGNOSIS — Z1283 Encounter for screening for malignant neoplasm of skin: Secondary | ICD-10-CM | POA: Diagnosis not present

## 2016-08-24 DIAGNOSIS — L570 Actinic keratosis: Secondary | ICD-10-CM | POA: Diagnosis not present

## 2016-08-24 DIAGNOSIS — D225 Melanocytic nevi of trunk: Secondary | ICD-10-CM | POA: Diagnosis not present

## 2016-12-26 DIAGNOSIS — Z23 Encounter for immunization: Secondary | ICD-10-CM | POA: Diagnosis not present

## 2017-01-11 ENCOUNTER — Ambulatory Visit (INDEPENDENT_AMBULATORY_CARE_PROVIDER_SITE_OTHER): Payer: Medicare Other | Admitting: Otolaryngology

## 2017-01-11 DIAGNOSIS — H903 Sensorineural hearing loss, bilateral: Secondary | ICD-10-CM | POA: Diagnosis not present

## 2017-01-30 DIAGNOSIS — Z961 Presence of intraocular lens: Secondary | ICD-10-CM | POA: Diagnosis not present

## 2017-02-13 ENCOUNTER — Ambulatory Visit (INDEPENDENT_AMBULATORY_CARE_PROVIDER_SITE_OTHER): Payer: Medicare Other | Admitting: Urology

## 2017-02-13 DIAGNOSIS — N401 Enlarged prostate with lower urinary tract symptoms: Secondary | ICD-10-CM

## 2017-02-22 DIAGNOSIS — D485 Neoplasm of uncertain behavior of skin: Secondary | ICD-10-CM | POA: Diagnosis not present

## 2017-02-22 DIAGNOSIS — D3613 Benign neoplasm of peripheral nerves and autonomic nervous system of lower limb, including hip: Secondary | ICD-10-CM | POA: Diagnosis not present

## 2017-02-22 DIAGNOSIS — D2122 Benign neoplasm of connective and other soft tissue of left lower limb, including hip: Secondary | ICD-10-CM | POA: Diagnosis not present

## 2017-07-03 DIAGNOSIS — N400 Enlarged prostate without lower urinary tract symptoms: Secondary | ICD-10-CM | POA: Diagnosis not present

## 2017-07-03 DIAGNOSIS — Z79899 Other long term (current) drug therapy: Secondary | ICD-10-CM | POA: Diagnosis not present

## 2017-07-03 DIAGNOSIS — N529 Male erectile dysfunction, unspecified: Secondary | ICD-10-CM | POA: Diagnosis not present

## 2017-07-03 DIAGNOSIS — R001 Bradycardia, unspecified: Secondary | ICD-10-CM | POA: Diagnosis not present

## 2017-07-09 DIAGNOSIS — N401 Enlarged prostate with lower urinary tract symptoms: Secondary | ICD-10-CM | POA: Diagnosis not present

## 2017-07-09 DIAGNOSIS — Z6824 Body mass index (BMI) 24.0-24.9, adult: Secondary | ICD-10-CM | POA: Diagnosis not present

## 2017-07-09 DIAGNOSIS — M19011 Primary osteoarthritis, right shoulder: Secondary | ICD-10-CM | POA: Diagnosis not present

## 2017-07-09 DIAGNOSIS — R001 Bradycardia, unspecified: Secondary | ICD-10-CM | POA: Diagnosis not present

## 2017-11-27 DIAGNOSIS — Z23 Encounter for immunization: Secondary | ICD-10-CM | POA: Diagnosis not present

## 2018-01-07 ENCOUNTER — Ambulatory Visit (INDEPENDENT_AMBULATORY_CARE_PROVIDER_SITE_OTHER): Payer: Medicare Other | Admitting: Otolaryngology

## 2018-01-07 DIAGNOSIS — H903 Sensorineural hearing loss, bilateral: Secondary | ICD-10-CM

## 2018-02-05 DIAGNOSIS — Z961 Presence of intraocular lens: Secondary | ICD-10-CM | POA: Diagnosis not present

## 2018-02-05 DIAGNOSIS — Z0100 Encounter for examination of eyes and vision without abnormal findings: Secondary | ICD-10-CM | POA: Diagnosis not present

## 2018-02-12 ENCOUNTER — Ambulatory Visit (INDEPENDENT_AMBULATORY_CARE_PROVIDER_SITE_OTHER): Payer: Medicare Other | Admitting: Urology

## 2018-02-12 DIAGNOSIS — N5201 Erectile dysfunction due to arterial insufficiency: Secondary | ICD-10-CM

## 2018-02-12 DIAGNOSIS — R972 Elevated prostate specific antigen [PSA]: Secondary | ICD-10-CM

## 2018-02-12 DIAGNOSIS — N401 Enlarged prostate with lower urinary tract symptoms: Secondary | ICD-10-CM

## 2018-02-12 DIAGNOSIS — R351 Nocturia: Secondary | ICD-10-CM | POA: Diagnosis not present

## 2018-07-04 ENCOUNTER — Encounter: Payer: Self-pay | Admitting: Internal Medicine

## 2018-07-31 DIAGNOSIS — R001 Bradycardia, unspecified: Secondary | ICD-10-CM | POA: Diagnosis not present

## 2018-07-31 DIAGNOSIS — Z79899 Other long term (current) drug therapy: Secondary | ICD-10-CM | POA: Diagnosis not present

## 2018-07-31 DIAGNOSIS — M1990 Unspecified osteoarthritis, unspecified site: Secondary | ICD-10-CM | POA: Diagnosis not present

## 2018-07-31 DIAGNOSIS — N400 Enlarged prostate without lower urinary tract symptoms: Secondary | ICD-10-CM | POA: Diagnosis not present

## 2018-07-31 DIAGNOSIS — N529 Male erectile dysfunction, unspecified: Secondary | ICD-10-CM | POA: Diagnosis not present

## 2018-08-06 DIAGNOSIS — N401 Enlarged prostate with lower urinary tract symptoms: Secondary | ICD-10-CM | POA: Diagnosis not present

## 2018-08-06 DIAGNOSIS — M5136 Other intervertebral disc degeneration, lumbar region: Secondary | ICD-10-CM | POA: Diagnosis not present

## 2018-08-06 DIAGNOSIS — R001 Bradycardia, unspecified: Secondary | ICD-10-CM | POA: Diagnosis not present

## 2018-08-07 ENCOUNTER — Encounter (INDEPENDENT_AMBULATORY_CARE_PROVIDER_SITE_OTHER): Payer: Self-pay | Admitting: *Deleted

## 2018-10-14 ENCOUNTER — Other Ambulatory Visit: Payer: Self-pay

## 2018-10-14 DIAGNOSIS — Z20822 Contact with and (suspected) exposure to covid-19: Secondary | ICD-10-CM

## 2018-10-16 LAB — NOVEL CORONAVIRUS, NAA: SARS-CoV-2, NAA: NOT DETECTED

## 2018-10-29 ENCOUNTER — Telehealth (INDEPENDENT_AMBULATORY_CARE_PROVIDER_SITE_OTHER): Payer: Self-pay | Admitting: *Deleted

## 2018-10-29 NOTE — Telephone Encounter (Signed)
10/23/18 left message for patient to call to schedule TCS  Referring MD/PCP: fagan   Procedure: tcs  Reason/Indication:  Screening, fam hx colon ca  Has patient had this procedure before?  Yes, 2010  If so, when, by whom and where?    Is there a family history of colon cancer?  Yes, maternal aunt  Who?  What age when diagnosed?    Is patient diabetic?   no      Does patient have prosthetic heart valve or mechanical valve?  no  Do you have a pacemaker/defibrillator?  no  Has patient ever had endocarditis/atrial fibrillation? no  Does patient use oxygen? no  Has patient had joint replacement within last 12 months?  no  Is patient constipated or do they take laxatives? no  Does patient have a history of alcohol/drug use?  no  Is patient on blood thinner such as Coumadin, Plavix and/or Aspirin? no  Medications: finasteride 5 mg daily, tamsulosin 4 mg daily, viagra 100 mg prn  Allergies: nkda  Pharmacy: [facility]  Medication Adjustment per Dr Charlena Cross, NP:   Procedure date & time:

## 2018-11-06 ENCOUNTER — Other Ambulatory Visit (INDEPENDENT_AMBULATORY_CARE_PROVIDER_SITE_OTHER): Payer: Self-pay | Admitting: *Deleted

## 2018-11-06 DIAGNOSIS — Z800 Family history of malignant neoplasm of digestive organs: Secondary | ICD-10-CM

## 2018-11-06 DIAGNOSIS — Z1211 Encounter for screening for malignant neoplasm of colon: Secondary | ICD-10-CM

## 2018-11-07 DIAGNOSIS — D225 Melanocytic nevi of trunk: Secondary | ICD-10-CM | POA: Diagnosis not present

## 2018-11-07 DIAGNOSIS — X32XXXD Exposure to sunlight, subsequent encounter: Secondary | ICD-10-CM | POA: Diagnosis not present

## 2018-11-07 DIAGNOSIS — D0339 Melanoma in situ of other parts of face: Secondary | ICD-10-CM | POA: Diagnosis not present

## 2018-11-07 DIAGNOSIS — L570 Actinic keratosis: Secondary | ICD-10-CM | POA: Diagnosis not present

## 2018-11-07 DIAGNOSIS — L821 Other seborrheic keratosis: Secondary | ICD-10-CM | POA: Diagnosis not present

## 2018-11-07 DIAGNOSIS — D485 Neoplasm of uncertain behavior of skin: Secondary | ICD-10-CM | POA: Diagnosis not present

## 2018-12-02 DIAGNOSIS — D0339 Melanoma in situ of other parts of face: Secondary | ICD-10-CM | POA: Diagnosis not present

## 2018-12-11 ENCOUNTER — Ambulatory Visit (INDEPENDENT_AMBULATORY_CARE_PROVIDER_SITE_OTHER): Payer: Medicare Other | Admitting: Plastic Surgery

## 2018-12-11 ENCOUNTER — Encounter: Payer: Self-pay | Admitting: Plastic Surgery

## 2018-12-11 ENCOUNTER — Other Ambulatory Visit: Payer: Self-pay

## 2018-12-11 VITALS — BP 124/73 | HR 85 | Temp 97.5°F | Ht 68.5 in | Wt 162.0 lb

## 2018-12-11 DIAGNOSIS — D0339 Melanoma in situ of other parts of face: Secondary | ICD-10-CM | POA: Diagnosis not present

## 2018-12-11 NOTE — Progress Notes (Signed)
Referring Provider Asencion Noble, MD [address] [place],  [place] [address]   CC:  Chief Complaint  Patient presents with  . Advice Only    for melanoma on the end of nose      Steve Hawkins is an 78 y.o. male.  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  He had no previous skin cancer.  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  He is interested in expectations regarding the reconstructive process.  And logistics of how things will be coordinated between me and his Mohs surgeon.  Allergies  Allergen Reactions  . Bee Venom Anaphylaxis    Outpatient Encounter Medications as of 12/11/2018  Medication Sig  . finasteride (PROSCAR) 5 MG tablet Take 1 tablet by mouth daily.  . sildenafil (VIAGRA) 100 MG tablet Take 100 mg by mouth daily as needed for erectile dysfunction.   . tamsulosin (FLOMAX) 0.4 MG CAPS capsule Take 1 capsule by mouth daily.  . [DISCONTINUED] doxycycline (VIBRAMYCIN) 100 MG capsule Take 1 capsule by mouth 2 (two) times daily.   No facility-administered encounter medications on file as of 12/11/2018.      Past Medical History:  Diagnosis Date  . Varicose veins     Past Surgical History:  Procedure Laterality Date  . CHOLECYSTECTOMY    . ENDOVENOUS ABLATION SAPHENOUS VEIN W/ LASER    . ENDOVENOUS ABLATION SAPHENOUS VEIN W/ LASER Left 12-21-2014   endovenous laser ablation left small saphenous vein  by Dr. Tinnie Gens     No family history on file.  Social History   Social History Narrative  . Not on file  Reports occasional tobacco use  Review of Systems General: Denies fevers, chills, weight loss CV: Denies chest pain, shortness of breath, palpitations  Physical Exam Vitals with BMI 12/11/2018 07/27/2015 04/12/2015  Height 5' 8.5" 5' 8.5" 5' 8.5"  Weight 162 lbs 165 lbs 162 lbs  BMI 24.27 [id number]  Systolic  [id number]  Diastolic 73 67 76  Pulse 85 72 59    General:  No acute distress,  Alert and oriented, Non-Toxic, Normal speech and affect HEENT: Extraocular movements are intact, cranial nerves grossly intact, he has an irregular poorly defined subtle pigmentation to the tip of his nose.  A healed biopsy site is evident.  The overall size is approximately 2 cm in diameter from what I can tell.  There is no obvious surrounding scars on his cheeks or forehead.  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  I explained if this truly was a melanoma in situ my hope would be as excision would be relatively superficial.  If this turns out to be the case I expect he would be a good candidate for a full-thickness skin graft for reconstruction.  I explained that should his excision go deeper or potentially involve the cartilage and the reconstructive process would be more complicated and likely involve an adjacent soft tissue flap.  I also explained that sometimes the wound benefits from a week or 2 of wound care prior to skin graft but that decision would be made after his Mohs excision.  I answered all of his questions to his satisfaction.  Cindra Presume 12/11/2018, 2:35 PM

## 2018-12-13 DIAGNOSIS — Z23 Encounter for immunization: Secondary | ICD-10-CM | POA: Diagnosis not present

## 2018-12-29 DIAGNOSIS — C801 Malignant (primary) neoplasm, unspecified: Secondary | ICD-10-CM

## 2018-12-29 HISTORY — DX: Malignant (primary) neoplasm, unspecified: C80.1

## 2018-12-31 ENCOUNTER — Other Ambulatory Visit (INDEPENDENT_AMBULATORY_CARE_PROVIDER_SITE_OTHER): Payer: Self-pay | Admitting: *Deleted

## 2018-12-31 ENCOUNTER — Telehealth (INDEPENDENT_AMBULATORY_CARE_PROVIDER_SITE_OTHER): Payer: Self-pay | Admitting: *Deleted

## 2018-12-31 ENCOUNTER — Encounter (INDEPENDENT_AMBULATORY_CARE_PROVIDER_SITE_OTHER): Payer: Self-pay | Admitting: *Deleted

## 2018-12-31 DIAGNOSIS — Z1211 Encounter for screening for malignant neoplasm of colon: Secondary | ICD-10-CM

## 2018-12-31 MED ORDER — PEG 3350-KCL-NA BICARB-NACL 420 G PO SOLR: 4000.0000 mL | Freq: Once | ORAL | 0 refills | Status: AC

## 2018-12-31 NOTE — Telephone Encounter (Signed)
Patient needs trilyte TCS sch'd 12/16 

## 2019-01-02 ENCOUNTER — Ambulatory Visit (INDEPENDENT_AMBULATORY_CARE_PROVIDER_SITE_OTHER): Payer: Medicare Other | Admitting: Plastic Surgery

## 2019-01-02 ENCOUNTER — Encounter: Payer: Self-pay | Admitting: Plastic Surgery

## 2019-01-02 ENCOUNTER — Other Ambulatory Visit: Payer: Self-pay

## 2019-01-02 VITALS — BP 135/79 | HR 66 | Temp 97.1°F | Ht 68.5 in | Wt 160.4 lb

## 2019-01-02 DIAGNOSIS — L989 Disorder of the skin and subcutaneous tissue, unspecified: Secondary | ICD-10-CM | POA: Diagnosis not present

## 2019-01-02 DIAGNOSIS — D0339 Melanoma in situ of other parts of face: Secondary | ICD-10-CM

## 2019-01-02 NOTE — Progress Notes (Signed)
   Referring Provider Asencion Noble, MD [address] [place],  [place] [address]   CC:  Chief Complaint  Patient presents with  . Follow-up    MOHS procedure on nose      Steve Hawkins is an 78 y.o. male.  HPI: Patient is here to discuss the outcome of his Mohs procedure.  He had his excision done earlier today and we need to sort out a plan for his reconstruction.  Overall he is happy with how things went at the Mohs office.  Physical Exam Vitals with BMI 01/02/2019 12/11/2018 07/27/2015  Height 5' 8.5" 5' 8.5" 5' 8.5"  Weight 160 lbs 6 oz 162 lbs 165 lbs  BMI 24.03 [id number]  Systolic [id number]  Diastolic 79 73 67  Pulse 66 85 72    On exam he has about a 2 cm diameter wound on the tip of his nose.  There does appear to be perichondrium left over the cartilage but the shape of the lower lateral cartilage is obvious on inspection.  Assessment/Plan Patient will be a good candidate for full-thickness skin graft after this has had a couple weeks to granulate.  I explained the rationale for waiting and he understood.  In my experience the contour and reliability of the take of the skin graft are better if a little bit of time goes by.  I gave him clear wound care instructions to do in the meantime.  We will plan to set up the skin graft to be done in the office in 2 weeks.  Cindra Presume 01/02/2019, 1:41 PM

## 2019-01-06 ENCOUNTER — Other Ambulatory Visit: Payer: Self-pay

## 2019-01-06 ENCOUNTER — Ambulatory Visit (INDEPENDENT_AMBULATORY_CARE_PROVIDER_SITE_OTHER): Payer: Medicare Other | Admitting: Otolaryngology

## 2019-01-06 ENCOUNTER — Telehealth: Payer: Self-pay | Admitting: Plastic Surgery

## 2019-01-06 DIAGNOSIS — H903 Sensorineural hearing loss, bilateral: Secondary | ICD-10-CM | POA: Diagnosis not present

## 2019-01-06 NOTE — Telephone Encounter (Signed)
Pt called to confirm that the strips given to him for his nose are dissolvable and do not need to be removed each day.

## 2019-01-06 NOTE — Telephone Encounter (Signed)
Called and spoke with the patient regarding the message below.  Asked the patient what type of strips was given to him.  Patient gave me the packet name for the Xeroform.  Informed the patient that the Xeroform does not dissolve and he can remove the Xeroform when he changes the dressing.  Patient asked when he takes the Xeroform off would it cause his nose to bleed, and I informed him that the Xeroform has petrolatum in it and if he is using Vasoline and keeping it moist the dressing should not stick to the area and it should not cause it to bleed.  Patient verbalized understanding and agreed.//AB/CMA

## 2019-01-13 ENCOUNTER — Ambulatory Visit (INDEPENDENT_AMBULATORY_CARE_PROVIDER_SITE_OTHER): Payer: Medicare Other | Admitting: Plastic Surgery

## 2019-01-13 ENCOUNTER — Other Ambulatory Visit: Payer: Self-pay

## 2019-01-13 VITALS — BP 121/74 | HR 89 | Temp 98.4°F | Ht 68.5 in | Wt 157.0 lb

## 2019-01-13 DIAGNOSIS — D0339 Melanoma in situ of other parts of face: Secondary | ICD-10-CM

## 2019-01-13 NOTE — Progress Notes (Signed)
Operative Note   DATE OF OPERATION: 01/13/2019  SURGICAL DEPARTMENT: Plastic Surgery  PREOPERATIVE DIAGNOSES:  Nasal mohs surgery defect  POSTOPERATIVE DIAGNOSES:  same  PROCEDURE:  1.  Surgical preparation for skin graft of nasal defect measuring 2.5 x 2 cm. 2.  Full-thickness skin graft to nasal Mohs surgery defect measuring 2.5 x 2 cm.  SURGEON: Talmadge Coventry, MD  ASSISTANT: None  ANESTHESIA:  General.   COMPLICATIONS: None.   INDICATIONS FOR PROCEDURE:  The patient, Steve Hawkins is a 78 y.o. male born on 12-01-1940, is here for treatment of a full-thickness nasal tip defect as a result of Mohs surgery. MRN: [id number]  CONSENT:  Informed consent was obtained directly from the patient. Risks, benefits and alternatives were fully discussed. Specific risks including but not limited to bleeding, infection, hematoma, seroma, scarring, pain, contracture, asymmetry, wound healing problems, and need for further surgery were all discussed. The patient did have an ample opportunity to have questions answered to satisfaction.   DESCRIPTION OF PROCEDURE:  Local anesthesia was administered.  The patient's operative site was prepped and draped in a sterile fashion. A time out was performed and all information was confirmed to be correct.  I started by harvesting the full-thickness skin graft from the left neck in line with a pre-existing crease.  This was done with a 15 blade.  The donor site was closed with interrupted buried 4-0 Monocryl sutures and a running 4-0 Monocryl subcuticular.  I then turned my attention to the nasal defect and this was debrided circumferentially with a 15 blade.  The colonized granulation tissue was scraped off with a 15 blade.  The skin edges were all freshened up with a 15 blade.  The graft was then defatted and inset with 5-0 chromic interrupted sutures.  Xeroform and 4-0 nylon was then used to fashion a bolster dressing.  The patient tolerated the  procedure well.  There were no complications. Marland Kitchen

## 2019-01-14 ENCOUNTER — Institutional Professional Consult (permissible substitution): Payer: Medicare Other | Admitting: Plastic Surgery

## 2019-01-15 ENCOUNTER — Ambulatory Visit (INDEPENDENT_AMBULATORY_CARE_PROVIDER_SITE_OTHER): Payer: Self-pay

## 2019-01-15 ENCOUNTER — Other Ambulatory Visit: Payer: Self-pay

## 2019-01-15 ENCOUNTER — Telehealth (INDEPENDENT_AMBULATORY_CARE_PROVIDER_SITE_OTHER): Payer: Self-pay | Admitting: *Deleted

## 2019-01-15 NOTE — Telephone Encounter (Signed)
Referring MD/PCP: fagan   Procedure: tcs  Reason/Indication:  Screening, fam hx colon ca  Has patient had this procedure before?  Yes, 2010             If so, when, by whom and where?    Is there a family history of colon cancer?  Yes, maternal aunt             Who?  What age when diagnosed?    Is patient diabetic?   no                                                  Does patient have prosthetic heart valve or mechanical valve?  no  Do you have a pacemaker/defibrillator?  no  Has patient ever had endocarditis/atrial fibrillation? no  Does patient use oxygen? no  Has patient had joint replacement within last 12 months?  no  Is patient constipated or do they take laxatives? no  Does patient have a history of alcohol/drug use?  no  Is patient on blood thinner such as Coumadin, Plavix and/or Aspirin? no  Medications: finasteride 5 mg daily, tamsulosin 4 mg daily, viagra 100 mg prn  Allergies: nkda  Pharmacy: [facility]  Medication Adjustment per Dr Charlena Cross, NP:   Procedure date & time: 02/12/19 at 1030

## 2019-01-20 ENCOUNTER — Other Ambulatory Visit: Payer: Self-pay

## 2019-01-20 ENCOUNTER — Ambulatory Visit (INDEPENDENT_AMBULATORY_CARE_PROVIDER_SITE_OTHER): Payer: Medicare Other | Admitting: Nurse Practitioner

## 2019-01-20 ENCOUNTER — Encounter: Payer: Self-pay | Admitting: Nurse Practitioner

## 2019-01-20 VITALS — BP 128/82 | HR 67 | Temp 97.8°F | Ht 68.5 in | Wt 158.0 lb

## 2019-01-20 DIAGNOSIS — Z945 Skin transplant status: Secondary | ICD-10-CM

## 2019-01-20 DIAGNOSIS — Z9889 Other specified postprocedural states: Secondary | ICD-10-CM | POA: Diagnosis not present

## 2019-01-20 DIAGNOSIS — D0339 Melanoma in situ of other parts of face: Secondary | ICD-10-CM

## 2019-01-20 NOTE — Progress Notes (Cosign Needed)
     Patient ID: Steve Hawkins, male    DOB: 26-Sep-1940, 78 y.o.   MRN: [id number]   C.C.: post-op follow up  Steve Hawkins is a 78 yo male who underwent full-thickness skin graft to nasal tip Mohs surgery defect by Dr. Claudia Desanctis on 01/13/19. Graft harvested from left neck. Patient has been covering the graft donor site with a band-aid and keeping it dry. Patient states "I was told to keep it dry." Denies any new bleeding or drainage from either site. Patient states he has been uncomfortable and rates his pain as 4/10.   Review of Systems  Constitutional: Positive for activity change.  HENT: Positive for rhinorrhea.   Respiratory: Negative.   Cardiovascular: Negative.   Gastrointestinal: Negative.   Genitourinary: Negative.   Musculoskeletal: Negative.   Skin: Positive for wound.  Neurological: Negative.     Past Medical History:  Diagnosis Date  . Varicose veins     Past Surgical History:  Procedure Laterality Date  . CHOLECYSTECTOMY    . ENDOVENOUS ABLATION SAPHENOUS VEIN W/ LASER    . ENDOVENOUS ABLATION SAPHENOUS VEIN W/ LASER Left 12-21-2014   endovenous laser ablation left small saphenous vein  by Dr. Tinnie Gens       Current Outpatient Medications:  .  finasteride (PROSCAR) 5 MG tablet, Take 1 tablet by mouth daily., Disp: , Rfl:  .  sildenafil (VIAGRA) 100 MG tablet, Take 100 mg by mouth daily as needed for erectile dysfunction. , Disp: , Rfl:  .  tamsulosin (FLOMAX) 0.4 MG CAPS capsule, Take 1 capsule by mouth daily., Disp: , Rfl:    Objective:   Vitals:   01/20/19 1045  BP: 128/82  Pulse: 67  Temp: 97.8 F (36.6 C)  SpO2: 97%    Physical Exam  General: alert, calm, no acute distress HEENT: slightly moist xeroform sutured to tip of nose, moderate to large amount dried blood surrounding graft site, no surrounding erythema or edema Neck: left neck incision covered with steri-strips and band-aid, no surrounding erythema,edema, or drainage Chest: symmetrical  rise and fall Lungs: unlabored breathing Musculoskeletal: MAEx4 Neuro: A&O x3, calm, cooperative, steady gait Skin: warm, dry   Assessment & Plan:  Melanoma in situ of nose (HCC)  Status post Mohs surgery  Status post full thickness skin graft  Steve Hawkins is a 78 yo male POD #7 s/p full-thickness skin graft to nasal Mohs surgery defect by Dr. Claudia Desanctis. The graft site was not disturbed. The xeroform remains sutured. There is dried blood around the graft site, but no active bleeding or drainage. The donor site incision was carefully assessed and appears to be healing well. The sutures were not disturbed and steri-strips were reinforced. Return in 1 week to see Dr. Claudia Desanctis.     Steve Batty, NP

## 2019-01-29 ENCOUNTER — Other Ambulatory Visit: Payer: Self-pay

## 2019-01-29 ENCOUNTER — Ambulatory Visit (INDEPENDENT_AMBULATORY_CARE_PROVIDER_SITE_OTHER): Payer: Medicare Other | Admitting: Plastic Surgery

## 2019-01-29 ENCOUNTER — Encounter: Payer: Self-pay | Admitting: Plastic Surgery

## 2019-01-29 VITALS — BP 123/75 | HR 67 | Temp 98.4°F | Wt 158.0 lb

## 2019-01-29 DIAGNOSIS — D0339 Melanoma in situ of other parts of face: Secondary | ICD-10-CM

## 2019-01-29 NOTE — Progress Notes (Signed)
Patient is doing well postop from a full-thickness skin graft to the nose.  Bolster was removed today revealing complete take.  Some of the scabbing around the edges was excised but I expect this will go on to do well.  The graft at this point is a little bit prominent compared to the surrounding skin but I expect that this will smooth out with time as it still quite early.  I given him wound care instructions to just cover this with a Band-Aid for the next few weeks and planning to see him again after Christmas.  I did explain to him the typical progression of the skin graft and I expect after a few weeks it will look quite a bit better than it does today will still gradually improve for many months thereafter in terms of the color and contour.  He is fully understanding and will see him at his next visit.

## 2019-02-03 NOTE — Telephone Encounter (Signed)
Colonoscopy with conscious sedation 

## 2019-02-04 ENCOUNTER — Ambulatory Visit (INDEPENDENT_AMBULATORY_CARE_PROVIDER_SITE_OTHER): Payer: Medicare Other | Admitting: Urology

## 2019-02-04 DIAGNOSIS — R972 Elevated prostate specific antigen [PSA]: Secondary | ICD-10-CM

## 2019-02-04 DIAGNOSIS — N401 Enlarged prostate with lower urinary tract symptoms: Secondary | ICD-10-CM

## 2019-02-04 DIAGNOSIS — N5201 Erectile dysfunction due to arterial insufficiency: Secondary | ICD-10-CM | POA: Diagnosis not present

## 2019-02-04 DIAGNOSIS — R351 Nocturia: Secondary | ICD-10-CM | POA: Diagnosis not present

## 2019-02-10 ENCOUNTER — Other Ambulatory Visit (HOSPITAL_COMMUNITY): Payer: Medicare Other

## 2019-02-11 ENCOUNTER — Other Ambulatory Visit: Payer: Self-pay | Admitting: Urology

## 2019-02-24 ENCOUNTER — Other Ambulatory Visit: Payer: Self-pay

## 2019-02-24 ENCOUNTER — Encounter: Payer: Self-pay | Admitting: Plastic Surgery

## 2019-02-24 ENCOUNTER — Ambulatory Visit (INDEPENDENT_AMBULATORY_CARE_PROVIDER_SITE_OTHER): Payer: Medicare Other | Admitting: Plastic Surgery

## 2019-02-24 VITALS — BP 138/78 | HR 75 | Temp 97.5°F | Ht 68.0 in | Wt 159.0 lb

## 2019-02-24 DIAGNOSIS — Z945 Skin transplant status: Secondary | ICD-10-CM

## 2019-02-24 NOTE — Progress Notes (Signed)
Patient is here for follow-up after full-thickness skin graft to the nose.  He is overall very happy and is doing great.  On exam the graft is a complete take and has an excellent color match.  There is some subtle contour step-off between the graft and the surrounding nose but he sees these improving with time and I suspect that we will continue to do so.  The graft donor site has healed well on his neck.  He says intermittently there is some swelling in one particular spot but it has gone down recently and does not bother him.  This could be a suture granuloma and hopefully resolves but if it does not he knows to return and I can evaluate it further.  At the moment I discussed sun avoidance for the next 6 months and good regular use of sunscreen.  I brought up the potential need for dermabrasion of the graft site but suspect that this will smooth out enough so that he will not be interested.  At the moment he is happy enough with his results and we will plan to see him again as needed.

## 2019-03-14 DIAGNOSIS — Z23 Encounter for immunization: Secondary | ICD-10-CM | POA: Diagnosis not present

## 2019-03-26 ENCOUNTER — Telehealth (INDEPENDENT_AMBULATORY_CARE_PROVIDER_SITE_OTHER): Payer: Self-pay | Admitting: *Deleted

## 2019-03-26 ENCOUNTER — Encounter (INDEPENDENT_AMBULATORY_CARE_PROVIDER_SITE_OTHER): Payer: Self-pay | Admitting: *Deleted

## 2019-03-26 ENCOUNTER — Other Ambulatory Visit (INDEPENDENT_AMBULATORY_CARE_PROVIDER_SITE_OTHER): Payer: Self-pay | Admitting: *Deleted

## 2019-03-26 DIAGNOSIS — Z800 Family history of malignant neoplasm of digestive organs: Secondary | ICD-10-CM

## 2019-03-26 DIAGNOSIS — Z1211 Encounter for screening for malignant neoplasm of colon: Secondary | ICD-10-CM

## 2019-03-26 NOTE — Telephone Encounter (Signed)
Referring MD/PCP:fagan   Procedure:tcs  Reason/Indication:Screening, fam hx colon ca  Has patient had this procedure before?Yes, 2010 If so, when, by whom and where?   Is there a family history of colon cancer?Yes, maternal aunt Who? What age when diagnosed?   Is patient diabetic?no  Does patient have prosthetic heart valve or mechanical valve?no  Do you have a pacemaker/defibrillator?no  Has patient ever had endocarditis/atrial fibrillation?no  Does patient use oxygen?no  Has patient had joint replacement within last 12 months?no  Is patient constipated or do they take laxatives?no  Does patient have a history of alcohol/drug use?no  Is patient on blood thinner such as Coumadin, Plavix and/or Aspirin?no  Medications:finasteride 5 mg daily, tamsulosin 4 mg daily, viagra 100 mg prn  Allergies:nkda  Pharmacy: [facility]  Medication Adjustment per Dr Laural Golden   Procedure date & time:04/23/19

## 2019-03-26 NOTE — Telephone Encounter (Signed)
Patient needs suprep tcs sch'd 2/24

## 2019-03-28 MED ORDER — SUPREP BOWEL PREP KIT 17.5-3.13-1.6 GM/177ML PO SOLN: 1.0000 | Freq: Once | ORAL | 0 refills | Status: AC

## 2019-03-28 NOTE — Telephone Encounter (Signed)
Sent to pharmacy 

## 2019-04-07 NOTE — Telephone Encounter (Signed)
Colonoscopy with conscious sedation 

## 2019-04-10 ENCOUNTER — Other Ambulatory Visit: Payer: Self-pay

## 2019-04-10 DIAGNOSIS — N401 Enlarged prostate with lower urinary tract symptoms: Secondary | ICD-10-CM

## 2019-04-10 MED ORDER — FINASTERIDE 5 MG PO TABS: 5.0000 mg | ORAL_TABLET | Freq: Every day | ORAL | 11 refills | Status: DC

## 2019-04-11 DIAGNOSIS — Z23 Encounter for immunization: Secondary | ICD-10-CM | POA: Diagnosis not present

## 2019-04-21 ENCOUNTER — Other Ambulatory Visit: Payer: Self-pay

## 2019-04-21 ENCOUNTER — Other Ambulatory Visit (HOSPITAL_COMMUNITY)
Admission: RE | Admit: 2019-04-21 | Discharge: 2019-04-21 | Disposition: A | Discharge status: Home / Self Care | Payer: Medicare Other | Source: Ambulatory Visit | Attending: Internal Medicine | Admitting: Internal Medicine

## 2019-04-21 DIAGNOSIS — Z20822 Contact with and (suspected) exposure to covid-19: Secondary | ICD-10-CM | POA: Insufficient documentation

## 2019-04-21 DIAGNOSIS — Z01812 Encounter for preprocedural laboratory examination: Secondary | ICD-10-CM | POA: Insufficient documentation

## 2019-04-21 LAB — SARS CORONAVIRUS 2 (TAT 6-24 HRS): SARS Coronavirus 2: NEGATIVE

## 2019-04-23 ENCOUNTER — Encounter (HOSPITAL_COMMUNITY): Payer: Self-pay | Admitting: Internal Medicine

## 2019-04-23 ENCOUNTER — Ambulatory Visit (HOSPITAL_COMMUNITY): Admit: 2019-04-23 | Payer: Medicare Other | Admitting: Internal Medicine

## 2019-04-23 ENCOUNTER — Encounter (HOSPITAL_COMMUNITY)
Admission: RE | Disposition: A | Discharge status: Home / Self Care | Payer: Self-pay | Source: Home / Self Care | Attending: Internal Medicine

## 2019-04-23 ENCOUNTER — Other Ambulatory Visit: Payer: Self-pay

## 2019-04-23 ENCOUNTER — Ambulatory Visit (HOSPITAL_COMMUNITY)
Admission: RE | Admit: 2019-04-23 | Discharge: 2019-04-23 | Disposition: A | Discharge status: Home / Self Care | Payer: Medicare Other | Attending: Internal Medicine | Admitting: Internal Medicine

## 2019-04-23 ENCOUNTER — Encounter (HOSPITAL_COMMUNITY): Payer: Self-pay

## 2019-04-23 DIAGNOSIS — Z800 Family history of malignant neoplasm of digestive organs: Secondary | ICD-10-CM | POA: Insufficient documentation

## 2019-04-23 DIAGNOSIS — N529 Male erectile dysfunction, unspecified: Secondary | ICD-10-CM | POA: Insufficient documentation

## 2019-04-23 DIAGNOSIS — Z87891 Personal history of nicotine dependence: Secondary | ICD-10-CM | POA: Insufficient documentation

## 2019-04-23 DIAGNOSIS — K644 Residual hemorrhoidal skin tags: Secondary | ICD-10-CM | POA: Insufficient documentation

## 2019-04-23 DIAGNOSIS — D122 Benign neoplasm of ascending colon: Secondary | ICD-10-CM | POA: Diagnosis not present

## 2019-04-23 DIAGNOSIS — Z1211 Encounter for screening for malignant neoplasm of colon: Secondary | ICD-10-CM

## 2019-04-23 DIAGNOSIS — K5730 Diverticulosis of large intestine without perforation or abscess without bleeding: Secondary | ICD-10-CM | POA: Diagnosis not present

## 2019-04-23 DIAGNOSIS — D121 Benign neoplasm of appendix: Secondary | ICD-10-CM | POA: Diagnosis not present

## 2019-04-23 DIAGNOSIS — Z87892 Personal history of anaphylaxis: Secondary | ICD-10-CM | POA: Diagnosis not present

## 2019-04-23 DIAGNOSIS — Z79899 Other long term (current) drug therapy: Secondary | ICD-10-CM | POA: Diagnosis not present

## 2019-04-23 DIAGNOSIS — Z91030 Bee allergy status: Secondary | ICD-10-CM | POA: Diagnosis not present

## 2019-04-23 DIAGNOSIS — D125 Benign neoplasm of sigmoid colon: Secondary | ICD-10-CM | POA: Insufficient documentation

## 2019-04-23 DIAGNOSIS — Z85820 Personal history of malignant melanoma of skin: Secondary | ICD-10-CM | POA: Insufficient documentation

## 2019-04-23 HISTORY — PX: POLYPECTOMY: SHX5525

## 2019-04-23 HISTORY — PX: COLONOSCOPY: SHX5424

## 2019-04-23 SURGERY — COLONOSCOPY
Anesthesia: Moderate Sedation

## 2019-04-23 MED ORDER — MIDAZOLAM HCL 5 MG/5ML IJ SOLN
INTRAMUSCULAR | Status: AC
  Filled 2019-04-23: qty 10

## 2019-04-23 MED ORDER — MIDAZOLAM HCL 5 MG/5ML IJ SOLN
INTRAMUSCULAR | Status: DC | PRN
  Administered 2019-04-23: 2 mg via INTRAVENOUS
  Administered 2019-04-23 (×2): 1 mg via INTRAVENOUS

## 2019-04-23 MED ORDER — MEPERIDINE HCL 50 MG/ML IJ SOLN
INTRAMUSCULAR | Status: DC | PRN
  Administered 2019-04-23: 10 mg via INTRAVENOUS
  Administered 2019-04-23: 20 mg via INTRAVENOUS

## 2019-04-23 MED ORDER — STERILE WATER FOR IRRIGATION IR SOLN
Status: DC | PRN
  Administered 2019-04-23: 10:00:00 1.5 mL

## 2019-04-23 MED ORDER — SODIUM CHLORIDE 0.9 % IV SOLN
INTRAVENOUS | Status: DC
  Administered 2019-04-23: 09:00:00 1000 mL via INTRAVENOUS

## 2019-04-23 MED ORDER — MEPERIDINE HCL 50 MG/ML IJ SOLN
INTRAMUSCULAR | Status: AC
  Filled 2019-04-23: qty 1

## 2019-04-23 NOTE — Discharge Instructions (Signed)
No aspirin or NSAIDs for 24 hours. Resume usual medications as before High-fiber diet No driving for 24 hours. Physician will call with biopsy results.     Colonoscopy, Adult, Care After This sheet gives you information about how to care for yourself after your procedure. Your doctor may also give you more specific instructions. If you have problems or questions, call your doctor. What can I expect after the procedure? After the procedure, it is common to have:  A small amount of blood in your poop (stool) for 24 hours.  Some gas.  Mild cramping or bloating in your belly (abdomen). Follow these instructions at home: Eating and drinking   Drink enough fluid to keep your pee (urine) pale yellow.  Follow instructions from your doctor about what you cannot eat or drink.  Return to your normal diet as told by your doctor. Avoid heavy or fried foods that are hard to digest. Activity  Rest as told by your doctor.  Do not sit for a long time without moving. Get up to take short walks every 1-2 hours. This is important. Ask for help if you feel weak or unsteady.  Return to your normal activities as told by your doctor. Ask your doctor what activities are safe for you. To help cramping and bloating:   Try walking around.  Put heat on your belly as told by your doctor. Use the heat source that your doctor recommends, such as a moist heat pack or a heating pad. ? Put a towel between your skin and the heat source. ? Leave the heat on for 20-30 minutes. ? Remove the heat if your skin turns bright red. This is very important if you are unable to feel pain, heat, or cold. You may have a greater risk of getting burned. General instructions  For the first 24 hours after the procedure: ? Do not drive or use machinery. ? Do not sign important documents. ? Do not drink alcohol. ? Do your daily activities more slowly than normal. ? Eat foods that are soft and easy to digest.  Take  over-the-counter or prescription medicines only as told by your doctor.  Keep all follow-up visits as told by your doctor. This is important. Contact a doctor if:  You have blood in your poop 2-3 days after the procedure. Get help right away if:  You have more than a small amount of blood in your poop.  You see large clumps of tissue (blood clots) in your poop.  Your belly is swollen.  You feel like you may vomit (nauseous).  You vomit.  You have a fever.  You have belly pain that gets worse, and medicine does not help your pain. Summary  After the procedure, it is common to have a small amount of blood in your poop. You may also have mild cramping and bloating in your belly.  For the first 24 hours after the procedure, do not drive or use machinery, do not sign important documents, and do not drink alcohol.  Get help right away if you have a lot of blood in your poop, feel like you may vomit, have a fever, or have more belly pain. This information is not intended to replace advice given to you by your health care provider. Make sure you discuss any questions you have with your health care provider. Document Revised: 09/09/2018 Document Reviewed: 09/09/2018 Elsevier Patient Education  2020 Elsevier Inc.     High-Fiber Diet Fiber, also called dietary fiber,   is a type of carbohydrate that is found in fruits, vegetables, whole grains, and beans. A high-fiber diet can have many health benefits. Your health care provider may recommend a high-fiber diet to help:  Prevent constipation. Fiber can make your bowel movements more regular.  Lower your cholesterol.  Relieve the following conditions: ? Swelling of veins in the anus (hemorrhoids). ? Swelling and irritation (inflammation) of specific areas of the digestive tract (uncomplicated diverticulosis). ? A problem of the large intestine (colon) that sometimes causes pain and diarrhea (irritable bowel syndrome, IBS).  Prevent  overeating as part of a weight-loss plan.  Prevent heart disease, type 2 diabetes, and certain cancers. What is my plan? The recommended daily fiber intake in grams (g) includes:  38 g for men age 50 or younger.  30 g for men over age 50.  25 g for women age 50 or younger.  21 g for women over age 50. You can get the recommended daily intake of dietary fiber by:  Eating a variety of fruits, vegetables, grains, and beans.  Taking a fiber supplement, if it is not possible to get enough fiber through your diet. What do I need to know about a high-fiber diet?  It is better to get fiber through food sources rather than from fiber supplements. There is not a lot of research about how effective supplements are.  Always check the fiber content on the nutrition facts label of any prepackaged food. Look for foods that contain 5 g of fiber or more per serving.  Talk with a diet and nutrition specialist (dietitian) if you have questions about specific foods that are recommended or not recommended for your medical condition, especially if those foods are not listed below.  Gradually increase how much fiber you consume. If you increase your intake of dietary fiber too quickly, you may have bloating, cramping, or gas.  Drink plenty of water. Water helps you to digest fiber. What are tips for following this plan?  Eat a wide variety of high-fiber foods.  Make sure that half of the grains that you eat each day are whole grains.  Eat breads and cereals that are made with whole-grain flour instead of refined flour or white flour.  Eat brown rice, bulgur wheat, or millet instead of white rice.  Start the day with a breakfast that is high in fiber, such as a cereal that contains 5 g of fiber or more per serving.  Use beans in place of meat in soups, salads, and pasta dishes.  Eat high-fiber snacks, such as berries, raw vegetables, nuts, and popcorn.  Choose whole fruits and vegetables instead  of processed forms like juice or sauce. What foods can I eat?  Fruits Berries. Pears. Apples. Oranges. Avocado. Prunes and raisins. Dried figs. Vegetables Sweet potatoes. Spinach. Kale. Artichokes. Cabbage. Broccoli. Cauliflower. Green peas. Carrots. Squash. Grains Whole-grain breads. Multigrain cereal. Oats and oatmeal. Brown rice. Barley. Bulgur wheat. Millet. Quinoa. Bran muffins. Popcorn. Rye wafer crackers. Meats and other proteins Navy, kidney, and pinto beans. Soybeans. Split peas. Lentils. Nuts and seeds. Dairy Fiber-fortified yogurt. Beverages Fiber-fortified soy milk. Fiber-fortified orange juice. Other foods Fiber bars. The items listed above may not be a complete list of recommended foods and beverages. Contact a dietitian for more options. What foods are not recommended? Fruits Fruit juice. Cooked, strained fruit. Vegetables Fried potatoes. Canned vegetables. Well-cooked vegetables. Grains White bread. Pasta made with refined flour. White rice. Meats and other proteins Fatty cuts of meat. Fried   chicken or fried fish. Dairy Milk. Yogurt. Cream cheese. Sour cream. Fats and oils Butters. Beverages Soft drinks. Other foods Cakes and pastries. The items listed above may not be a complete list of foods and beverages to avoid. Contact a dietitian for more information. Summary  Fiber is a type of carbohydrate. It is found in fruits, vegetables, whole grains, and beans.  There are many health benefits of eating a high-fiber diet, such as preventing constipation, lowering blood cholesterol, helping with weight loss, and reducing your risk of heart disease, diabetes, and certain cancers.  Gradually increase your intake of fiber. Increasing too fast can result in cramping, bloating, and gas. Drink plenty of water while you increase your fiber.  The best sources of fiber include whole fruits and vegetables, whole grains, nuts, seeds, and beans. This information is not  intended to replace advice given to you by your health care provider. Make sure you discuss any questions you have with your health care provider. Document Revised: 12/18/2016 Document Reviewed: 12/18/2016 Elsevier Patient Education  2020 Elsevier Inc.  

## 2019-04-23 NOTE — Op Note (Signed)
[facility] Patient Name: Steve Hawkins Procedure Date: 04/23/2019 9:08 AM MRN: [id number] Date of Birth: 02/02/1941 Attending MD: Hildred Laser , MD CSN: [id number] Age: 79 Admit Type: Outpatient Procedure:                Colonoscopy Indications:              Screening for colorectal malignant neoplasm Providers:                Hildred Laser, MD, Otis Peak B. Sharon Seller, RN, Raphael Gibney, Technician Referring MD:             Asencion Noble, MD Medicines:                Meperidine 30 mg IV, Midazolam 4 mg IV Complications:            No immediate complications. Estimated Blood Loss:     Estimated blood loss was minimal. Procedure:                Pre-Anesthesia Assessment:                           - Prior to the procedure, a History and Physical                            was performed, and patient medications and                            allergies were reviewed. The patient's tolerance of                            previous anesthesia was also reviewed. The risks                            and benefits of the procedure and the sedation                            options and risks were discussed with the patient.                            All questions were answered, and informed consent                            was obtained. Prior Anticoagulants: The patient has                            taken no previous anticoagulant or antiplatelet                            agents. ASA Grade Assessment: II - A patient with                            mild systemic disease. After reviewing the risks  and benefits, the patient was deemed in                            satisfactory condition to undergo the procedure.                           After obtaining informed consent, the colonoscope                            was passed under direct vision. Throughout the                            procedure, the patient's blood pressure, pulse, and                           oxygen saturations were monitored continuously. The                            PCF-[id number]) scope was introduced through                            the anus and advanced to the the cecum, identified                            by appendiceal orifice and ileocecal valve. The                            colonoscopy was performed without difficulty. The                            patient tolerated the procedure well. The quality                            of the bowel preparation was good. The ileocecal                            valve, appendiceal orifice, and rectum were                            photographed. Scope In: 9:35:52 AM Scope Out: 10:03:52 AM Scope Withdrawal Time: 0 hours 22 minutes 41 seconds  Total Procedure Duration: 0 hours 28 minutes 0 seconds  Findings:      Skin tags were found on perianal exam.      The digital rectal exam was normal.      A small polyp with appearence of donut was found in the appendiceal       orifice. Biopsies were taken with a cold forceps for histology. The       pathology specimen was placed into Bottle Number 1.      Six polyps were found in the sigmoid colon and ascending colon. The       polyps were small in size. These were biopsied with a cold forceps for       histology. The pathology specimen was placed into Bottle Number 2.      Scattered diverticula were found in  the sigmoid colon.      External hemorrhoids were found during retroflexion. The hemorrhoids       were small. Impression:               - Perianal skin tags found on perianal exam.                           - One small polyp at the appendiceal orifice.                            Biopsied.                           - Six small polyps in the sigmoid colon and in the                            ascending colon. Biopsied.                           - Diverticulosis in the sigmoid colon.                           - External hemorrhoids. Moderate  Sedation:      Moderate (conscious) sedation was administered by the endoscopy nurse       and supervised by the endoscopist. The following parameters were       monitored: oxygen saturation, heart rate, blood pressure, CO2       capnography and response to care. Total physician intraservice time was       31 minutes. Recommendation:           - Patient has a contact number available for                            emergencies. The signs and symptoms of potential                            delayed complications were discussed with the                            patient. Return to normal activities tomorrow.                            Written discharge instructions were provided to the                            patient.                           - High fiber diet today.                           - Continue present medications.                           - No aspirin, ibuprofen, naproxen, or other  non-steroidal anti-inflammatory drugs for 1 day.                           - Await pathology results.                           - Repeat colonoscopy is recommended. The                            colonoscopy date will be determined after pathology                            results from today's exam become available for                            review. Procedure Code(s):        --- Professional ---                           [phone], Colonoscopy, flexible; with biopsy, single                            or multiple                           99153, Moderate sedation; each additional 15                            minutes intraservice time                           G0500, Moderate sedation services provided by the                            same physician or other qualified health care                            professional performing a gastrointestinal                            endoscopic service that sedation supports,                            requiring the presence of  an independent trained                            observer to assist in the monitoring of the                            patient's level of consciousness and physiological                            status; initial 15 minutes of intra-service time;                            patient age 52 years or older (additional  time may                            be reported with [phone], as appropriate) Diagnosis Code(s):        --- Professional ---                           K63.5, Polyp of colon                           K64.4, Residual hemorrhoidal skin tags                           Z12.11, Encounter for screening for malignant                            neoplasm of colon                           K57.30, Diverticulosis of large intestine without                            perforation or abscess without bleeding CPT copyright 2019 American [employer]. All rights reserved. The codes documented in this report are preliminary and upon coder review may  be revised to meet current compliance requirements. Hildred Laser, MD Hildred Laser, MD 04/23/2019 10:13:32 AM This report has been signed electronically. Number of Addenda: 0

## 2019-04-23 NOTE — H&P (Signed)
Steve Hawkins is an 79 y.o. male.   Chief Complaint: Patient is here for colonoscopy HPI: Patient is 79 year old Caucasian male who was in excellent health who is here for screening colonoscopy.  Last exam was in April 2010 was normal.  He denies abdominal pain change in bowel habits or rectal bleeding. He does not take aspirin NSAIDs or anticoagulants. Family history significant for colon carcinoma in a maternal aunt.  Past Medical History:  Diagnosis Date  . Cancer ([place]) 12/2018   skin cancer-melanoma  . Varicose veins     Past Surgical History:  Procedure Laterality Date  . CHOLECYSTECTOMY    . ENDOVENOUS ABLATION SAPHENOUS VEIN W/ LASER    . ENDOVENOUS ABLATION SAPHENOUS VEIN W/ LASER Left 12-21-2014   endovenous laser ablation left small saphenous vein  by Dr. Tinnie Gens   . HEMORRHOID SURGERY      Family History  Problem Relation Age of Onset  . Colon cancer Maternal Aunt    Social History:  reports that he has quit smoking. He has never used smokeless tobacco. He reports current alcohol use of about 4.0 standard drinks of alcohol per week. He reports that he does not use drugs.  Allergies:  Allergies  Allergen Reactions  . Bee Venom Anaphylaxis    Medications Prior to Admission  Medication Sig Dispense Refill  . cholecalciferol (VITAMIN D3) 25 MCG (1000 UNIT) tablet Take 1,000 Units by mouth daily.    . finasteride (PROSCAR) 5 MG tablet Take 1 tablet (5 mg total) by mouth daily. 30 tablet 11  . sildenafil (VIAGRA) 100 MG tablet Take 100 mg by mouth daily as needed for erectile dysfunction.     . tamsulosin (FLOMAX) 0.4 MG CAPS capsule Take 0.4 mg by mouth daily.     Manus Gunning BOWEL PREP KIT 17.5-3.13-1.6 GM/177ML SOLN Take 354 mLs by mouth once.       No results found for this or any previous visit (from the past 48 hour(s)). No results found.  Review of Systems  Blood pressure 125/72, pulse (!) 59, temperature (!) 97.3 F (36.3 C), temperature source Oral,  resp. rate 14, height 5' 8.5" (1.74 m), weight 71.7 kg, SpO2 97 %. Physical Exam  Constitutional: He appears well-developed and well-nourished.  HENT:  Mouth/Throat: Oropharynx is clear and moist.  Eyes: Conjunctivae are normal. No scleral icterus.  Neck: No thyromegaly present.  Cardiovascular: Normal rate, regular rhythm and normal heart sounds.  No murmur heard. Respiratory: Effort normal and breath sounds normal.  GI:  Abdomen is symmetrical.  He has right upper paramedian scar and keratosis lower abdomen.  Abdomen is soft and nontender with organomegaly or masses.  Musculoskeletal:        General: No edema.  Lymphadenopathy:    He has no cervical adenopathy.  Neurological: He is alert.  Skin: Skin is warm and dry.     Assessment/Plan Average risk screening colonoscopy.  Hildred Laser, MD 04/23/2019, 9:28 AM

## 2019-04-24 LAB — SURGICAL PATHOLOGY

## 2019-07-05 DIAGNOSIS — Z20828 Contact with and (suspected) exposure to other viral communicable diseases: Secondary | ICD-10-CM | POA: Diagnosis not present

## 2019-07-05 DIAGNOSIS — Z03818 Encounter for observation for suspected exposure to other biological agents ruled out: Secondary | ICD-10-CM | POA: Diagnosis not present

## 2019-07-31 DIAGNOSIS — N529 Male erectile dysfunction, unspecified: Secondary | ICD-10-CM | POA: Diagnosis not present

## 2019-07-31 DIAGNOSIS — R001 Bradycardia, unspecified: Secondary | ICD-10-CM | POA: Diagnosis not present

## 2019-07-31 DIAGNOSIS — N400 Enlarged prostate without lower urinary tract symptoms: Secondary | ICD-10-CM | POA: Diagnosis not present

## 2019-07-31 DIAGNOSIS — Z79899 Other long term (current) drug therapy: Secondary | ICD-10-CM | POA: Diagnosis not present

## 2019-08-07 DIAGNOSIS — Z85820 Personal history of malignant melanoma of skin: Secondary | ICD-10-CM | POA: Diagnosis not present

## 2019-08-07 DIAGNOSIS — M7540 Impingement syndrome of unspecified shoulder: Secondary | ICD-10-CM | POA: Diagnosis not present

## 2019-08-07 DIAGNOSIS — R002 Palpitations: Secondary | ICD-10-CM | POA: Diagnosis not present

## 2019-08-07 DIAGNOSIS — N401 Enlarged prostate with lower urinary tract symptoms: Secondary | ICD-10-CM | POA: Diagnosis not present

## 2019-08-07 DIAGNOSIS — Z6824 Body mass index (BMI) 24.0-24.9, adult: Secondary | ICD-10-CM | POA: Diagnosis not present

## 2019-08-12 ENCOUNTER — Telehealth: Payer: Self-pay

## 2019-08-12 DIAGNOSIS — R002 Palpitations: Secondary | ICD-10-CM

## 2019-08-12 NOTE — Telephone Encounter (Signed)
Fax sent by Dr.Fagan for 72 hr holter for palpitations    Enrolled in [place] on 08/12/19

## 2019-09-16 DIAGNOSIS — I8310 Varicose veins of unspecified lower extremity with inflammation: Secondary | ICD-10-CM | POA: Diagnosis not present

## 2019-10-09 ENCOUNTER — Other Ambulatory Visit: Payer: Self-pay

## 2019-10-09 DIAGNOSIS — D225 Melanocytic nevi of trunk: Secondary | ICD-10-CM | POA: Diagnosis not present

## 2019-10-09 DIAGNOSIS — Z08 Encounter for follow-up examination after completed treatment for malignant neoplasm: Secondary | ICD-10-CM | POA: Diagnosis not present

## 2019-10-09 DIAGNOSIS — Z85820 Personal history of malignant melanoma of skin: Secondary | ICD-10-CM | POA: Diagnosis not present

## 2019-10-09 DIAGNOSIS — D485 Neoplasm of uncertain behavior of skin: Secondary | ICD-10-CM | POA: Diagnosis not present

## 2019-10-09 DIAGNOSIS — Z1283 Encounter for screening for malignant neoplasm of skin: Secondary | ICD-10-CM | POA: Diagnosis not present

## 2019-10-09 DIAGNOSIS — N529 Male erectile dysfunction, unspecified: Secondary | ICD-10-CM

## 2019-10-09 MED ORDER — SILDENAFIL CITRATE 100 MG PO TABS: 100.0000 mg | ORAL_TABLET | Freq: Every day | ORAL | 4 refills | Status: DC | PRN

## 2019-12-19 DIAGNOSIS — Z23 Encounter for immunization: Secondary | ICD-10-CM | POA: Diagnosis not present

## 2020-01-08 DIAGNOSIS — Z1283 Encounter for screening for malignant neoplasm of skin: Secondary | ICD-10-CM | POA: Diagnosis not present

## 2020-01-08 DIAGNOSIS — L905 Scar conditions and fibrosis of skin: Secondary | ICD-10-CM | POA: Diagnosis not present

## 2020-01-08 DIAGNOSIS — Z85820 Personal history of malignant melanoma of skin: Secondary | ICD-10-CM | POA: Diagnosis not present

## 2020-01-08 DIAGNOSIS — D225 Melanocytic nevi of trunk: Secondary | ICD-10-CM | POA: Diagnosis not present

## 2020-01-08 DIAGNOSIS — Z08 Encounter for follow-up examination after completed treatment for malignant neoplasm: Secondary | ICD-10-CM | POA: Diagnosis not present

## 2020-01-08 DIAGNOSIS — D485 Neoplasm of uncertain behavior of skin: Secondary | ICD-10-CM | POA: Diagnosis not present

## 2020-01-21 DIAGNOSIS — H903 Sensorineural hearing loss, bilateral: Secondary | ICD-10-CM | POA: Diagnosis not present

## 2020-01-21 DIAGNOSIS — H838X3 Other specified diseases of inner ear, bilateral: Secondary | ICD-10-CM | POA: Diagnosis not present

## 2020-02-03 ENCOUNTER — Other Ambulatory Visit: Payer: Self-pay

## 2020-02-03 ENCOUNTER — Ambulatory Visit (INDEPENDENT_AMBULATORY_CARE_PROVIDER_SITE_OTHER): Payer: Medicare Other | Admitting: Urology

## 2020-02-03 ENCOUNTER — Encounter: Payer: Self-pay | Admitting: Urology

## 2020-02-03 VITALS — BP 113/75 | HR 67 | Temp 98.6°F | Ht 68.5 in | Wt 155.0 lb

## 2020-02-03 DIAGNOSIS — N5201 Erectile dysfunction due to arterial insufficiency: Secondary | ICD-10-CM

## 2020-02-03 DIAGNOSIS — R972 Elevated prostate specific antigen [PSA]: Secondary | ICD-10-CM

## 2020-02-03 DIAGNOSIS — N401 Enlarged prostate with lower urinary tract symptoms: Secondary | ICD-10-CM | POA: Diagnosis not present

## 2020-02-03 LAB — URINALYSIS, ROUTINE W REFLEX MICROSCOPIC
Bilirubin, UA: NEGATIVE
Glucose, UA: NEGATIVE
Nitrite, UA: NEGATIVE
Protein,UA: NEGATIVE
RBC, UA: NEGATIVE
Specific Gravity, UA: 1.025 (ref 1.005–1.030)
Urobilinogen, Ur: 1 mg/dL (ref 0.2–1.0)
pH, UA: 5 (ref 5.0–7.5)

## 2020-02-03 LAB — MICROSCOPIC EXAMINATION
Epithelial Cells (non renal): NONE SEEN /hpf (ref 0–10)
RBC, Urine: NONE SEEN /hpf (ref 0–2)
Renal Epithel, UA: NONE SEEN /hpf

## 2020-02-03 NOTE — Progress Notes (Signed)
Urological Symptom Review ° °Patient is experiencing the following symptoms: °Frequent urination °Get up at night to urinate °Weak stream ° ° °Review of Systems ° °Gastrointestinal (upper)  : °Negative for upper GI symptoms ° °Gastrointestinal (lower) : °Negative for lower GI symptoms ° °Constitutional : °Negative for symptoms ° °Skin: °Negative for skin symptoms ° °Eyes: °Negative for eye symptoms ° °Ear/Nose/Throat : °Negative for Ear/Nose/Throat symptoms ° °Hematologic/Lymphatic: °Negative for Hematologic/Lymphatic symptoms ° °Cardiovascular : °Leg swelling ° °Respiratory : °Negative for respiratory symptoms ° °Endocrine: °Negative for endocrine symptoms ° °Musculoskeletal: °Negative for musculoskeletal symptoms ° °Neurological: °Negative for neurological symptoms ° °Psychologic: °Negative for psychiatric symptoms °

## 2020-02-03 NOTE — Progress Notes (Signed)
H&P  Chief Complaint: PSA, BPH & ED  History of Present Illness:   12.7.2021: Pt here for annual check-up. He continues on tamsulosin and finasteride and denies any significant LUTS at this time.  He needs rf of sildenafil.  IPSS Questionnaire (AUA-7): Over the past month.   1)  How often have you had a sensation of not emptying your bladder completely after you finish urinating?  2 - Less than half the time  2)  How often have you had to urinate again less than two hours after you finished urinating? 2 - Less than half the time  3)  How often have you found you stopped and started again several times when you urinated?  1 - Less than 1 time in 5  4) How difficult have you found it to postpone urination?  2 - Less than half the time  5) How often have you had a weak urinary stream?  1 - Less than 1 time in 5  6) How often have you had to push or strain to begin urination?  1 - Less than 1 time in 5  7) How many times did you most typically get up to urinate from the time you went to bed until the time you got up in the morning?  3 - 3 times  Total score:  0-7 mildly symptomatic   8-19 moderately symptomatic   20-35 severely symptomatic  QoL Score: 2   (below copied from [facility] records):  CC: Elevated PSA-Established Patient  HPI: Steve Hawkins is a 79 year-old male established patient who is here for follow up for further evaluation of his elevated PSA.  12.17.2019: His last PSA was performed 02/13/2017. The last PSA value was 1.5. The patient states he does take 5 alpha reductase inhibitor medication. He has had a prostate biopsy done.   February, 1999- underwent TRUS/Bx which revealed only BPH and inflammatory change. At that time his PSA was approximately 5.2. Because of this PSA elevation while on finasteride, he underwent repeat TRUS/Bx on 7.8.2013. Prostatic volume was 104 cc and all 12 cores were benign, 2 revealed acute and chronic inflammation.   12.8.2020: Here today for  follow-up. 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 He denies any significant changes in urinary sx's -- continues on dual medical therapy w/ finasteride and tamsulosin. He is very satisfied with his urinary pattern but has been researching BPH interventions independently. He does report some decreased libido and breast tenderness that he attributes to finasteride. He has also apparently had some falls in the last year and has had some worsened dizziness/balance issues.   CC: BPH  HPI: Patient is currently treated with Flomax, finasteride for his symptoms.   CC: I have erectile dysfunction (Meds).  HPI: He is currently on Viagra for treatment of his erectile dysfunction.   12.8.2020: He uses sildenafil which is adequate to augment his erections.    Past Medical History:  Diagnosis Date  . Cancer ([employer]) 12/2018   skin cancer-melanoma  . Varicose veins     Past Surgical History:  Procedure Laterality Date  . CHOLECYSTECTOMY    . COLONOSCOPY N/A 04/23/2019   Procedure: COLONOSCOPY;  Surgeon: Rogene Houston, MD;  Location: [facility];  Service: Endoscopy;  Laterality: N/A;  930  . ENDOVENOUS ABLATION SAPHENOUS VEIN W/ LASER    . ENDOVENOUS ABLATION SAPHENOUS VEIN W/ LASER Left 12-21-2014   endovenous laser ablation left small saphenous vein  by Dr. Tinnie Gens   . HEMORRHOID SURGERY    . POLYPECTOMY  04/23/2019   Procedure: POLYPECTOMY;  Surgeon: Rogene Houston, MD;  Location: [facility];  Service: Endoscopy;;    Home Medications:  Allergies as of 02/03/2020      Reactions   Bee Venom Anaphylaxis      Medication List       Accurate as of February 03, 2020  9:01 AM. If you have any questions, ask your nurse or doctor.        cholecalciferol 25 MCG (1000 UNIT) tablet Commonly known as: VITAMIN D3 Take 1,000 Units by mouth daily.   finasteride 5 MG tablet Commonly known as: PROSCAR Take 1 tablet (5 mg total) by mouth daily.    sildenafil 100 MG tablet Commonly known as: VIAGRA Take 1 tablet (100 mg total) by mouth daily as needed for erectile dysfunction.   tamsulosin 0.4 MG Caps capsule Commonly known as: FLOMAX Take 0.4 mg by mouth daily.       Allergies:  Allergies  Allergen Reactions  . Bee Venom Anaphylaxis    Family History  Problem Relation Age of Onset  . Colon cancer Maternal Aunt     Social History:  reports that he has quit smoking. He has never used smokeless tobacco. He reports current alcohol use of about 4.0 standard drinks of alcohol per week. He reports that he does not use drugs.  ROS: A complete review of systems was performed.  All systems are negative except for pertinent findings as noted.  Physical Exam:  Vital signs in last 24 hours: There were no vitals taken for this visit. Constitutional:  Alert and oriented, No acute distress  Respiratory: Normal respiratory effort GI: Abdomen is soft, nontender, nondistended, no abdominal masses. No CVAT. No hernias Genitourinary: Normal male phallus, testes are descended bilaterally and non-tender and without masses, scrotum is normal in appearance without lesions or masses, perineum is normal on inspection. Prostate feels about 50 grams. Neurologic: Grossly intact, no focal deficits Psychiatric: Normal mood and affect  I have reviewed prior pt notes  I have reviewed urinalysis results  I have independently reviewed prior imaging  I have reviewed prior PSA results--last one in 2018 was 3.0    Impression/Assessment:  ED: Pt stable and symptoms are well-managed on sildenafil.  BPH: DRE shows prostate is 50 grams but otherwise unremarkable. Pt symptoms are well-managed on finasteride and tamsulosin and he requires no intervention at this time.  PSA: Pt PSA trend and DRE has been adequately stable and requires no additional monitoring at this time.  Plan:  1. Pt refilled on sildenafil and continued on finasteride and  tamsulosin  2. F/U in 1 year for OV, DRE, and symptom recheck.  CC: Dr. Asencion Noble

## 2020-02-04 DIAGNOSIS — Z23 Encounter for immunization: Secondary | ICD-10-CM | POA: Diagnosis not present

## 2020-03-09 DIAGNOSIS — H353131 Nonexudative age-related macular degeneration, bilateral, early dry stage: Secondary | ICD-10-CM | POA: Diagnosis not present

## 2020-03-09 DIAGNOSIS — Z961 Presence of intraocular lens: Secondary | ICD-10-CM | POA: Diagnosis not present

## 2020-03-09 DIAGNOSIS — H35371 Puckering of macula, right eye: Secondary | ICD-10-CM | POA: Diagnosis not present

## 2020-03-25 ENCOUNTER — Encounter (INDEPENDENT_AMBULATORY_CARE_PROVIDER_SITE_OTHER): Payer: Medicare Other | Admitting: Ophthalmology

## 2020-03-29 ENCOUNTER — Other Ambulatory Visit: Payer: Self-pay

## 2020-03-29 ENCOUNTER — Ambulatory Visit (INDEPENDENT_AMBULATORY_CARE_PROVIDER_SITE_OTHER): Payer: Medicare Other | Admitting: Ophthalmology

## 2020-03-29 ENCOUNTER — Encounter (INDEPENDENT_AMBULATORY_CARE_PROVIDER_SITE_OTHER): Payer: Self-pay | Admitting: Ophthalmology

## 2020-03-29 DIAGNOSIS — H353132 Nonexudative age-related macular degeneration, bilateral, intermediate dry stage: Secondary | ICD-10-CM | POA: Insufficient documentation

## 2020-03-29 DIAGNOSIS — H35371 Puckering of macula, right eye: Secondary | ICD-10-CM

## 2020-03-29 NOTE — Assessment & Plan Note (Signed)

## 2020-03-29 NOTE — Assessment & Plan Note (Signed)

## 2020-03-29 NOTE — Progress Notes (Signed)
03/29/2020     CHIEF COMPLAINT Patient presents for Retina Evaluation (NP-EPIRETINAL MEMBRANE OD-ARMD OU Ref'd by Gershon Crane ///Pt denies any pain or pressure OU. )   HISTORY OF PRESENT ILLNESS: Steve Hawkins is a 80 y.o. male who presents to the clinic today for:   HPI    Retina Evaluation    In both eyes.  This started 6 months ago.  Duration of 6 months.  Associated Symptoms Flashes and Floaters.  Context:  near vision and reading. Additional comments: NP-EPIRETINAL MEMBRANE OD-ARMD OU Ref'd by Gershon Crane    Pt denies any pain or pressure OU.        Last edited by Nichola Sizer D on 03/29/2020  3:02 PM. (History)      Referring physician: Asencion Noble, MD [address] [place],  [place] [address]  HISTORICAL INFORMATION:   Selected notes from the MEDICAL RECORD NUMBER       CURRENT MEDICATIONS: No current outpatient medications on file. (Ophthalmic Drugs)   No current facility-administered medications for this visit. (Ophthalmic Drugs)   Current Outpatient Medications (Other)  Medication Sig  . cholecalciferol (VITAMIN D3) 25 MCG (1000 UNIT) tablet Take 1,000 Units by mouth daily.  . finasteride (PROSCAR) 5 MG tablet Take 1 tablet (5 mg total) by mouth daily.  . sildenafil (VIAGRA) 100 MG tablet Take 1 tablet (100 mg total) by mouth daily as needed for erectile dysfunction.  . tamsulosin (FLOMAX) 0.4 MG CAPS capsule Take 0.4 mg by mouth daily.    No current facility-administered medications for this visit. (Other)      REVIEW OF SYSTEMS:    ALLERGIES Allergies  Allergen Reactions  . Bee Venom Anaphylaxis    PAST MEDICAL HISTORY Past Medical History:  Diagnosis Date  . Cancer ([place]) 12/2018   skin cancer-melanoma  . Varicose veins    Past Surgical History:  Procedure Laterality Date  . CHOLECYSTECTOMY    . COLONOSCOPY N/A 04/23/2019   Procedure: COLONOSCOPY;  Surgeon: Rogene Houston, MD;  [facility];  Service: Endoscopy;   Laterality: N/A;  930  . ENDOVENOUS ABLATION SAPHENOUS VEIN W/ LASER    . ENDOVENOUS ABLATION SAPHENOUS VEIN W/ LASER Left 12-21-2014   endovenous laser ablation left small saphenous vein  by Dr. Tinnie Gens   . HEMORRHOID SURGERY    . POLYPECTOMY  04/23/2019   Procedure: POLYPECTOMY;  Surgeon: Rogene Houston, MD;  [facility]: [facility];  Service: Endoscopy;;    FAMILY HISTORY Family History  Problem Relation Age of Onset  . Colon cancer Maternal Aunt     SOCIAL HISTORY Social History   Tobacco Use  . Smoking status: Former Research scientist (life sciences)  . Smokeless tobacco: Never Used  Vaping Use  . Vaping Use: Never used  Substance Use Topics  . Alcohol use: Yes    Alcohol/week: 4.0 standard drinks    Types: 1 Cans of beer, 1 Shots of liquor, 2 Standard drinks or equivalent per week  . Drug use: No         OPHTHALMIC EXAM: Base Eye Exam    Visual Acuity (ETDRS)      Right Left   Dist [place] 20/50 -1 20/40   Dist ph [place] NI NI       Tonometry (Tonopen, 3:10 PM)      Right Left   Pressure 14 18       Pupils      Pupils Dark Light Shape React APD   Right PERRL  3 2 Round Slow None   Left PERRL 3 2 Round Slow None       Visual Fields (Counting fingers)      Left Right    Full Full       Extraocular Movement      Right Left    limited limited    -- -- --  --  --  -- -- --   -- -- --  --  --  -- -- --         Neuro/Psych    Oriented x3: Yes       Dilation    Both eyes: 1.0% Mydriacyl, 2.5% Phenylephrine @ 3:10 PM        Slit Lamp and Fundus Exam    External Exam      Right Left   External Normal Normal       Slit Lamp Exam      Right Left   Lids/Lashes Normal Normal   Conjunctiva/Sclera White and quiet White and quiet   Cornea Clear Clear   Anterior Chamber Deep and quiet Deep and quiet   Iris Round and reactive Round and reactive   Lens Centered posterior chamber intraocular lens, multifocal Centered posterior chamber intraocular lens, multifocal    Anterior Vitreous Normal Normal       Fundus Exam      Right Left   Posterior Vitreous Posterior vitreous detachment Posterior vitreous detachment   Disc Normal Normal   C/D Ratio 0.15 0.2   Macula Epiretinal membrane, mod topo distortion, Hard drusen, no hemorrhage Soft drusen, no hemorrhage, no macular thickening   Vessels Normal Normal   Periphery Normal Normal          IMAGING AND PROCEDURES  Imaging and Procedures for 03/29/20  OCT, Retina - OU - Both Eyes       Right Eye Quality was good. Scan locations included subfoveal. Central Foveal Thickness: 375. Progression has no prior data. Findings include abnormal foveal contour, epiretinal membrane, retinal drusen , no IRF, no SRF.   Left Eye Quality was good. Scan locations included subfoveal. Central Foveal Thickness: 301. Progression has no prior data. Findings include retinal drusen , pigment epithelial detachment, no IRF, no SRF.   Notes Incidental posterior vitreous detachment.  OU.  Epiretinal membrane with retinal thickening and elevation of the center foveal depression.  OD.  OS with drusenoid pigment epithelial detachments, no signs of active CNVM                ASSESSMENT/PLAN:  Macular pucker, right eye The nature of macular pucker (epiretinal membrane ERM) was discussed with the patient as well as threshold criteria for vitrectomy surgery. I explained that in rare cases another surgery is needed to actually remove a second wrinkle should it regrow.  Most often, the epiretinal membrane and underlying wrinkled internal limiting membrane are removed with the first surgery, to accomplish the goals.   If the operative eye is Phakic (natural lens still present), cataract surgery is often recommended prior to Vitrectomy. This will enable the retina surgeon to have the best view during surgery and the patient to obtain optimal results in the future. Treatment options were discussed.  Intermediate stage  nonexudative age-related macular degeneration of both eyes The nature of age--related macular degeneration was discussed with the patient as well as the distinction between dry and wet types. Checking an Amsler Grid daily with advice to return immediately should a distortion develop, was given to the patient.  The patient 's smoking status now and in the past was determined and advice based on the AREDS study was provided regarding the consumption of antioxidant supplements. AREDS 2 vitamin formulation was recommended. Consumption of dark leafy vegetables and fresh fruits of various colors was recommended. Treatment modalities for wet macular degeneration particularly the use of intravitreal injections of anti-blood vessel growth factors was discussed with the patient. Avastin, Lucentis, and Eylea are the available options. On occasion, therapy includes the use of photodynamic therapy and thermal laser. Stressed to the patient do not rub eyes.  Patient was advised to check Amsler Grid daily and return immediately if changes are noted. Instructions on using the grid were given to the patient. All patient questions were answered.      ICD-10-CM   1. Macular pucker, right eye  H35.371 OCT, Retina - OU - Both Eyes  2. Intermediate stage nonexudative age-related macular degeneration of both eyes  H35.3132 OCT, Retina - OU - Both Eyes    1.  Instructed to go    03/29/2020     CHIEF COMPLAINT Patient presents for Retina Evaluation (NP-EPIRETINAL MEMBRANE OD-ARMD OU Ref'd by Gershon Crane ///Pt denies any pain or pressure OU. )   HISTORY OF PRESENT ILLNESS: Steve Hawkins is a 80 y.o. male who presents to the clinic today for:   HPI    Retina Evaluation    In both eyes.  This started 6 months ago.  Duration of 6 months.  Associated Symptoms Flashes and Floaters.  Context:  near vision and reading. Additional comments: NP-EPIRETINAL MEMBRANE OD-ARMD OU Ref'd by Gershon Crane    Pt denies any pain or pressure  OU.        Last edited by Nichola Sizer D on 03/29/2020  3:02 PM. (History)      Referring physician: Asencion Noble, MD [address] [place],  [place] [address]  HISTORICAL INFORMATION:   Selected notes from the MEDICAL RECORD NUMBER       CURRENT MEDICATIONS: No current outpatient medications on file. (Ophthalmic Drugs)   No current facility-administered medications for this visit. (Ophthalmic Drugs)   Current Outpatient Medications (Other)  Medication Sig  . cholecalciferol (VITAMIN D3) 25 MCG (1000 UNIT) tablet Take 1,000 Units by mouth daily.  . finasteride (PROSCAR) 5 MG tablet Take 1 tablet (5 mg total) by mouth daily.  . sildenafil (VIAGRA) 100 MG tablet Take 1 tablet (100 mg total) by mouth daily as needed for erectile dysfunction.  . tamsulosin (FLOMAX) 0.4 MG CAPS capsule Take 0.4 mg by mouth daily.    No current facility-administered medications for this visit. (Other)      REVIEW OF SYSTEMS:    ALLERGIES Allergies  Allergen Reactions  . Bee Venom Anaphylaxis    PAST MEDICAL HISTORY Past Medical History:  Diagnosis Date  . Cancer ([place]) 12/2018   skin cancer-melanoma  . Varicose veins    Past Surgical History:  Procedure Laterality Date  . CHOLECYSTECTOMY    . COLONOSCOPY N/A 04/23/2019   Procedure: COLONOSCOPY;  Surgeon: Rogene Houston, MD;  [facility];  Service: Endoscopy;  Laterality: N/A;  930  . ENDOVENOUS ABLATION SAPHENOUS VEIN W/ LASER    . ENDOVENOUS ABLATION SAPHENOUS VEIN W/ LASER Left 12-21-2014   endovenous laser ablation left small saphenous vein  by Dr. Tinnie Gens   . HEMORRHOID SURGERY    . POLYPECTOMY  04/23/2019   Procedure: POLYPECTOMY;  Surgeon: Rogene Houston, MD;  [facility];  Service: Endoscopy;;    FAMILY HISTORY Family History  Problem Relation Age of Onset  . Colon cancer Maternal Aunt     SOCIAL HISTORY Social History   Tobacco Use  . Smoking status: Former Games developer  .  Smokeless tobacco: Never Used  Vaping Use  . Vaping Use: Never used  Substance Use Topics  . Alcohol use: Yes    Alcohol/week: 4.0 standard drinks    Types: 1 Cans of beer, 1 Shots of liquor, 2 Standard drinks or equivalent per week  . Drug use: No         OPHTHALMIC EXAM: Base Eye Exam    Visual Acuity (ETDRS)      Right Left   Dist [place] 20/50 -1 20/40   Dist ph [place] NI NI       Tonometry (Tonopen, 3:10 PM)      Right Left   Pressure 14 18       Pupils      Pupils Dark Light Shape React APD   Right PERRL 3 2 Round Slow None   Left PERRL 3 2 Round Slow None       Visual Fields (Counting fingers)      Left Right    Full Full       Extraocular Movement      Right Left    limited limited    -- -- --  --  --  -- -- --   -- -- --  --  --  -- -- --         Neuro/Psych    Oriented x3: Yes       Dilation    Both eyes: 1.0% Mydriacyl, 2.5% Phenylephrine @ 3:10 PM        Slit Lamp and Fundus Exam    External Exam      Right Left   External Normal Normal       Slit Lamp Exam      Right Left   Lids/Lashes Normal Normal   Conjunctiva/Sclera White and quiet White and quiet   Cornea Clear Clear   Anterior Chamber Deep and quiet Deep and quiet   Iris Round and reactive Round and reactive   Lens Centered posterior chamber intraocular lens, multifocal Centered posterior chamber intraocular lens, multifocal   Anterior Vitreous Normal Normal       Fundus Exam      Right Left   Posterior Vitreous Posterior vitreous detachment Posterior vitreous detachment   Disc Normal Normal   C/D Ratio 0.15 0.2   Macula Epiretinal membrane, mod topo distortion, Hard drusen, no hemorrhage Soft drusen, no hemorrhage, no macular thickening   Vessels Normal Normal   Periphery Normal Normal          IMAGING AND PROCEDURES  Imaging and Procedures for 03/29/20  OCT, Retina - OU - Both Eyes       Right Eye Quality was good. Scan locations included subfoveal. Central  Foveal Thickness: 375. Progression has no prior data. Findings include abnormal foveal contour, epiretinal membrane, retinal drusen , no IRF, no SRF.   Left Eye Quality was good. Scan locations included subfoveal. Central Foveal Thickness: 301. Progression has no prior data. Findings include retinal drusen , pigment epithelial detachment, no IRF, no SRF.   Notes Incidental posterior vitreous detachment.  OU.  Epiretinal membrane with retinal thickening and elevation of the center foveal depression.  OD.  OS with drusenoid pigment epithelial detachments, no signs of active CNVM  ASSESSMENT/PLAN:  Macular pucker, right eye The nature of macular pucker (epiretinal membrane ERM) was discussed with the patient as well as threshold criteria for vitrectomy surgery. I explained that in rare cases another surgery is needed to actually remove a second wrinkle should it regrow.  Most often, the epiretinal membrane and underlying wrinkled internal limiting membrane are removed with the first surgery, to accomplish the goals.   If the operative eye is Phakic (natural lens still present), cataract surgery is often recommended prior to Vitrectomy. This will enable the retina surgeon to have the best view during surgery and the patient to obtain optimal results in the future. Treatment options were discussed.  Intermediate stage nonexudative age-related macular degeneration of both eyes The nature of age--related macular degeneration was discussed with the patient as well as the distinction between dry and wet types. Checking an Amsler Grid daily with advice to return immediately should a distortion develop, was given to the patient. The patient 's smoking status now and in the past was determined and advice based on the AREDS study was provided regarding the consumption of antioxidant supplements. AREDS 2 vitamin formulation was recommended. Consumption of dark leafy vegetables and fresh  fruits of various colors was recommended. Treatment modalities for wet macular degeneration particularly the use of intravitreal injections of anti-blood vessel growth factors was discussed with the patient. Avastin, Lucentis, and Eylea are the available options. On occasion, therapy includes the use of photodynamic therapy and thermal laser. Stressed to the patient do not rub eyes.  Patient was advised to check Amsler Grid daily and return immediately if changes are noted. Instructions on using the grid were given to the patient. All patient questions were answered.      ICD-10-CM   1. Macular pucker, right eye  H35.371 OCT, Retina - OU - Both Eyes  2. Intermediate stage nonexudative age-related macular degeneration of both eyes  H35.3132 OCT, Retina - OU - Both Eyes    1.  Patient instructed to use monocular testing at home particularly near vision task in order to demonstrate to the patient himself with any visual impact the epiretinal membrane has.  I explained the patient that visual acuity in the right eye is not correctable with glasses.  It is not impacted by macular degeneration at this time in the right eye.  I explained the patient that only surgical intervention would improve the visual functioning  2.  Samples of AREDS 2 formulation to slow the progression of age-related macular degeneration were offered and recommended to use 1 tablet twice daily if tolerable 3.  Ophthalmic Meds Ordered this visit:  No orders of the defined types were placed in this encounter.      Return in about 6 weeks (around 05/10/2020) for DILATE OU, OCT.  There are no Patient Instructions on file for this visit.   Explained the diagnoses, plan, and follow up with the patient and they expressed understanding.  Patient expressed understanding of the importance of proper follow up care.   Clent Demark Ciarah Peace M.D. Diseases & Surgery of the Retina and Vitreous Retina & Diabetic [place] 03/29/20     Abbreviations: M myopia (nearsighted); A astigmatism; H hyperopia (farsighted); P presbyopia; Mrx spectacle prescription;  CTL contact lenses; OD right eye; OS left eye; OU both eyes  XT exotropia; ET esotropia; PEK punctate epithelial keratitis; PEE punctate epithelial erosions; DES dry eye syndrome; MGD meibomian gland dysfunction; ATs artificial tears; PFAT's preservative free artificial tears; [place] nuclear sclerotic  cataract; PSC posterior subcapsular cataract; ERM epi-retinal membrane; PVD posterior vitreous detachment; RD retinal detachment; DM diabetes mellitus; DR diabetic retinopathy; NPDR non-proliferative diabetic retinopathy; PDR proliferative diabetic retinopathy; CSME clinically significant macular edema; DME diabetic macular edema; dbh dot blot hemorrhages; CWS cotton wool spot; POAG primary open angle glaucoma; C/D cup-to-disc ratio; HVF humphrey visual field; GVF goldmann visual field; OCT optical coherence tomography; IOP intraocular pressure; BRVO Branch retinal vein occlusion; CRVO central retinal vein occlusion; CRAO central retinal artery occlusion; BRAO branch retinal artery occlusion; RT retinal tear; SB scleral buckle; PPV pars plana vitrectomy; VH Vitreous hemorrhage; PRP panretinal laser photocoagulation; IVK intravitreal kenalog; VMT vitreomacular traction; MH Macular hole;  NVD neovascularization of the disc; NVE neovascularization elsewhere; AREDS age related eye disease study; ARMD age related macular degeneration; POAG primary open angle glaucoma; EBMD epithelial/anterior basement membrane dystrophy; ACIOL anterior chamber intraocular lens; IOL intraocular lens; PCIOL posterior chamber intraocular lens; Phaco/IOL phacoemulsification with intraocular lens placement; PRK photorefractive keratectomy; LASIK laser assisted in situ keratomileusis; HTN hypertension; DM diabetes mellitus; COPD chronic obstructive pulmonary disease  2.  3.  Ophthalmic Meds  Ordered this visit:  No orders of the defined types were placed in this encounter.      No follow-ups on file.  There are no Patient Instructions on file for this visit.   Explained the diagnoses, plan, and follow up with the patient and they expressed understanding.  Patient expressed understanding of the importance of proper follow up care.   Clent Demark Jerney Baksh M.D. Diseases & Surgery of the Retina and Vitreous Retina & Diabetic [place] 03/29/20     Abbreviations: M myopia (nearsighted); A astigmatism; H hyperopia (farsighted); P presbyopia; Mrx spectacle prescription;  CTL contact lenses; OD right eye; OS left eye; OU both eyes  XT exotropia; ET esotropia; PEK punctate epithelial keratitis; PEE punctate epithelial erosions; DES dry eye syndrome; MGD meibomian gland dysfunction; ATs artificial tears; PFAT's preservative free artificial tears; [place] nuclear sclerotic cataract; PSC posterior subcapsular cataract; ERM epi-retinal membrane; PVD posterior vitreous detachment; RD retinal detachment; DM diabetes mellitus; DR diabetic retinopathy; NPDR non-proliferative diabetic retinopathy; PDR proliferative diabetic retinopathy; CSME clinically significant macular edema; DME diabetic macular edema; dbh dot blot hemorrhages; CWS cotton wool spot; POAG primary open angle glaucoma; C/D cup-to-disc ratio; HVF humphrey visual field; GVF goldmann visual field; OCT optical coherence tomography; IOP intraocular pressure; BRVO Branch retinal vein occlusion; CRVO central retinal vein occlusion; CRAO central retinal artery occlusion; BRAO branch retinal artery occlusion; RT retinal tear; SB scleral buckle; PPV pars plana vitrectomy; VH Vitreous hemorrhage; PRP panretinal laser photocoagulation; IVK intravitreal kenalog; VMT vitreomacular traction; MH Macular hole;  NVD neovascularization of the disc; NVE neovascularization elsewhere; AREDS age related eye disease study; ARMD age related macular degeneration;  POAG primary open angle glaucoma; EBMD epithelial/anterior basement membrane dystrophy; ACIOL anterior chamber intraocular lens; IOL intraocular lens; PCIOL posterior chamber intraocular lens; Phaco/IOL phacoemulsification with intraocular lens placement; [place] photorefractive keratectomy; LASIK laser assisted in situ keratomileusis; HTN hypertension; DM diabetes mellitus; COPD chronic obstructive pulmonary disease

## 2020-04-01 DIAGNOSIS — Z85820 Personal history of malignant melanoma of skin: Secondary | ICD-10-CM | POA: Diagnosis not present

## 2020-04-01 DIAGNOSIS — D225 Melanocytic nevi of trunk: Secondary | ICD-10-CM | POA: Diagnosis not present

## 2020-04-01 DIAGNOSIS — Z08 Encounter for follow-up examination after completed treatment for malignant neoplasm: Secondary | ICD-10-CM | POA: Diagnosis not present

## 2020-04-01 DIAGNOSIS — Z1283 Encounter for screening for malignant neoplasm of skin: Secondary | ICD-10-CM | POA: Diagnosis not present

## 2020-04-01 DIAGNOSIS — B078 Other viral warts: Secondary | ICD-10-CM | POA: Diagnosis not present

## 2020-04-08 ENCOUNTER — Encounter: Payer: Self-pay | Admitting: Urology

## 2020-04-08 ENCOUNTER — Ambulatory Visit (INDEPENDENT_AMBULATORY_CARE_PROVIDER_SITE_OTHER): Payer: Medicare Other | Admitting: Urology

## 2020-04-08 ENCOUNTER — Other Ambulatory Visit: Payer: Self-pay

## 2020-04-08 VITALS — BP 116/76 | HR 85 | Temp 97.6°F | Ht 68.5 in | Wt 155.0 lb

## 2020-04-08 DIAGNOSIS — N5089 Other specified disorders of the male genital organs: Secondary | ICD-10-CM

## 2020-04-08 DIAGNOSIS — N451 Epididymitis: Secondary | ICD-10-CM

## 2020-04-08 LAB — URINALYSIS, ROUTINE W REFLEX MICROSCOPIC
Bilirubin, UA: NEGATIVE
Glucose, UA: NEGATIVE
Nitrite, UA: NEGATIVE
Protein,UA: NEGATIVE
RBC, UA: NEGATIVE
Specific Gravity, UA: >1.03 — ABNORMAL HIGH (ref 1.005–1.030)
Urobilinogen, Ur: 1 mg/dL (ref 0.2–1.0)
pH, UA: 5 (ref 5.0–7.5)

## 2020-04-08 LAB — MICROSCOPIC EXAMINATION
RBC, Urine: NONE SEEN /hpf (ref 0–2)
Renal Epithel, UA: NONE SEEN /hpf

## 2020-04-08 MED ORDER — SULFAMETHOXAZOLE-TRIMETHOPRIM 800-160 MG PO TABS: 1.0000 | ORAL_TABLET | Freq: Two times a day (BID) | ORAL | 0 refills | Status: DC

## 2020-04-08 NOTE — Progress Notes (Signed)
H&P  Chief Complaint: Right testicular swelling.  History of Present Illness:   04/08/20: Mr. Steve Hawkins returns today in f/u for the complaint of right testicular swelling that began Monday.  He has a lump on the testicle.  He had pain but that has resolved.  He has some frequency but no dysuria.  He has no fever.  He doesn't recall any heavy lifting prior to the event.  He has had a vasectomy.   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   His UA has 6-10 WBC and a few bacteria.   IPSS is 17/2.  12.7.2021: Pt here for annual check-up. He continues on tamsulosin and finasteride and denies any significant LUTS at this time.  He needs rf of sildenafil.  IPSS Questionnaire (AUA-7): Over the past month.   1)  How often have you had a sensation of not emptying your bladder completely after you finish urinating?  2 - Less than half the time  2)  How often have you had to urinate again less than two hours after you finished urinating? 2 - Less than half the time  3)  How often have you found you stopped and started again several times when you urinated?  1 - Less than 1 time in 5  4) How difficult have you found it to postpone urination?  2 - Less than half the time  5) How often have you had a weak urinary stream?  1 - Less than 1 time in 5  6) How often have you had to push or strain to begin urination?  1 - Less than 1 time in 5  7) How many times did you most typically get up to urinate from the time you went to bed until the time you got up in the morning?  3 - 3 times  Total score:  0-7 mildly symptomatic   8-19 moderately symptomatic   20-35 severely symptomatic  QoL Score: 2   (below copied from [facility] records):  CC: Elevated PSA-Established Patient  HPI: Steve Hawkins is a 80 year-old male established patient who is here for follow up for further evaluation of his elevated PSA.  12.17.2019: His last PSA was performed 02/13/2017. The last  PSA value was 1.5. The patient states he does take 5 alpha reductase inhibitor medication. He has had a prostate biopsy done.   February, 1999- underwent TRUS/Bx which revealed only BPH and inflammatory change. At that time his PSA was approximately 5.2. Because of this PSA elevation while on finasteride, he underwent repeat TRUS/Bx on 7.8.2013. Prostatic volume was 104 cc and all 12 cores were benign, 2 revealed acute and chronic inflammation.   12.8.2020: Here today for follow-up. 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 He denies any significant changes in urinary sx's -- continues on dual medical therapy w/ finasteride and tamsulosin. He is very satisfied with his urinary pattern but has been researching BPH interventions independently. He does report some decreased libido and breast tenderness that he attributes to finasteride. He has also apparently had some falls in the last year and has had some worsened dizziness/balance issues.   CC: BPH  HPI: Patient is currently treated with Flomax, finasteride for his symptoms.   CC: I have erectile dysfunction (Meds).  HPI: He is currently on Viagra for treatment of his erectile dysfunction.   12.8.2020: He uses sildenafil which is adequate to augment  his erections.    Past Medical History:  Diagnosis Date  . Cancer (Steve Hawkins) 12/2018   skin cancer-melanoma  . Varicose veins     Past Surgical History:  Procedure Laterality Date  . CHOLECYSTECTOMY    . COLONOSCOPY N/A 04/23/2019   Procedure: COLONOSCOPY;  Surgeon: Rogene Houston, MD;  [facility];  Service: Endoscopy;  Laterality: N/A;  930  . ENDOVENOUS ABLATION SAPHENOUS VEIN W/ LASER    . ENDOVENOUS ABLATION SAPHENOUS VEIN W/ LASER Left 12-21-2014   endovenous laser ablation left small saphenous vein  by Dr. Tinnie Gens   . HEMORRHOID SURGERY    . POLYPECTOMY  04/23/2019   Procedure: POLYPECTOMY;  Surgeon: Rogene Houston, MD;  [facility]: [facility];  Service: Endoscopy;;    Home Medications:  Allergies as of 04/08/2020      Reactions   Bee Venom Anaphylaxis      Medication List       Accurate as of April 08, 2020 10:47 AM. If you have any questions, ask your nurse or doctor.        cholecalciferol 25 MCG (1000 UNIT) tablet Commonly known as: VITAMIN D3 Take 1,000 Units by mouth daily.   finasteride 5 MG tablet Commonly known as: PROSCAR Take 1 tablet (5 mg total) by mouth daily.   sildenafil 100 MG tablet Commonly known as: VIAGRA Take 1 tablet (100 mg total) by mouth daily as needed for erectile dysfunction.   sulfamethoxazole-trimethoprim 800-160 MG tablet Commonly known as: BACTRIM DS Take 1 tablet by mouth every 12 (twelve) hours. Started by: Irine Seal, MD   tamsulosin 0.4 MG Caps capsule Commonly known as: FLOMAX Take 0.4 mg by mouth daily.       Allergies:  Allergies  Allergen Reactions  . Bee Venom Anaphylaxis    Family History  Problem Relation Age of Onset  . Colon cancer Maternal Aunt     Social History:  reports that he has quit smoking. He has never used smokeless tobacco. He reports current alcohol use of about 4.0 standard drinks of alcohol per week. He reports that he does not use drugs.  ROS: A complete review of systems was performed.  All systems are negative except for pertinent findings as noted.  Physical Exam:  Vital signs in last 24 hours: BP 116/76   Pulse 85   Temp 97.6 F (36.4 C)   Ht 5' 8.5" (1.74 m)   Wt 155 lb (70.3 kg)   BMI 23.22 kg/m  Constitutional:  Alert and oriented, No acute distress  GU: He has a 2-3cm tender mass of the right epididymal tail consistent with epididymitis.   I have reviewed prior pt notes  I have reviewed urinalysis results  I    Impression/Assessment:  Right epididymitis with mild pyuria.    Plan:   I will get a culture and start him on bactrim.  He has had a prior vasectomy so this could just be an epididymal  blowout.   I recommended an OTC NSAID as well.  He will return in 2-3 weeks to see Dr. Diona Fanti.   CC: Dr. Jonelle Sports ID: Steve Hawkins, male   DOB: June 23, 1940, 80 y.o.   MRN: [id number]

## 2020-04-08 NOTE — Progress Notes (Signed)
Urological Symptom Review  Patient is experiencing the following symptoms: Frequent urination Hard to postpone urination   Review of Systems  Gastrointestinal (upper)  : Negative for upper GI symptoms  Gastrointestinal (lower) : Negative for lower GI symptoms  Constitutional : Negative for symptoms  Skin: Negative for skin symptoms  Eyes: Negative for eye symptoms  Ear/Nose/Throat : Negative for Ear/Nose/Throat symptoms  Hematologic/Lymphatic: Negative for Hematologic/Lymphatic symptoms  Cardiovascular : Leg swelling  Respiratory : Negative for respiratory symptoms  Endocrine: Negative for endocrine symptoms  Musculoskeletal: Negative for musculoskeletal symptoms  Neurological: Negative for neurological symptoms  Psychologic: Negative for psychiatric symptoms

## 2020-04-12 ENCOUNTER — Other Ambulatory Visit: Payer: Self-pay

## 2020-04-12 DIAGNOSIS — N401 Enlarged prostate with lower urinary tract symptoms: Secondary | ICD-10-CM

## 2020-04-12 MED ORDER — TAMSULOSIN HCL 0.4 MG PO CAPS: 0.4000 mg | ORAL_CAPSULE | Freq: Every day | ORAL | 11 refills | Status: DC

## 2020-04-13 LAB — URINE CULTURE

## 2020-04-14 ENCOUNTER — Telehealth: Payer: Self-pay

## 2020-04-15 NOTE — Telephone Encounter (Signed)
yes

## 2020-04-15 NOTE — Telephone Encounter (Signed)
Patient called and made aware of new appointment date and time.

## 2020-04-16 ENCOUNTER — Telehealth: Payer: Self-pay

## 2020-04-16 NOTE — Telephone Encounter (Signed)
-----   Message from Franchot Gallo, MD sent at 04/15/2020  6:36 PM EST ----- Notify pt that a few bacteria grew out in culture--continue abx as directed ----- Message ----- From: Dorisann Frames, RN Sent: 04/13/2020  11:56 AM EST To: Franchot Gallo, MD  Please review

## 2020-04-16 NOTE — Telephone Encounter (Signed)
Patient called and made aware.

## 2020-05-03 NOTE — Progress Notes (Signed)
History of Present Illness: Steve Hawkins is here today for follow-up.  He was seen by Dr. Irine Seal about a month ago for right testicular/epididymal swelling that was felt to possibly be due to epididymal blowout versus inflammatory process.  Treated with anti-inflammatories and sulfa.  Symptoms have resolved.  No urinary difficulties.  He does need his finasteride refilled.  IPSS 15, quality-of-life score 2  Past Medical History:  Diagnosis Date  . Cancer ([place]) 12/2018   skin cancer-melanoma  . Varicose veins     Past Surgical History:  Procedure Laterality Date  . CHOLECYSTECTOMY    . COLONOSCOPY N/A 04/23/2019   Procedure: COLONOSCOPY;  Surgeon: Rogene Houston, MD;  [facility];  Service: Endoscopy;  Laterality: N/A;  930  . ENDOVENOUS ABLATION SAPHENOUS VEIN W/ LASER    . ENDOVENOUS ABLATION SAPHENOUS VEIN W/ LASER Left 12-21-2014   endovenous laser ablation left small saphenous vein  by Dr. Tinnie Gens   . HEMORRHOID SURGERY    . POLYPECTOMY  04/23/2019   Procedure: POLYPECTOMY;  Surgeon: Rogene Houston, MD;  [facility];  Service: Endoscopy;;    Home Medications:  Allergies as of 05/04/2020      Reactions   Bee Venom Anaphylaxis      Medication List       Accurate as of May 03, 2020  8:04 PM. If you have any questions, ask your nurse or doctor.        cholecalciferol 25 MCG (1000 UNIT) tablet Commonly known as: VITAMIN D3 Take 1,000 Units by mouth daily.   finasteride 5 MG tablet Commonly known as: PROSCAR Take 1 tablet (5 mg total) by mouth daily.   sildenafil 100 MG tablet Commonly known as: VIAGRA Take 1 tablet (100 mg total) by mouth daily as needed for erectile dysfunction.   sulfamethoxazole-trimethoprim 800-160 MG tablet Commonly known as: BACTRIM DS Take 1 tablet by mouth every 12 (twelve) hours.   tamsulosin 0.4 MG Caps capsule Commonly known as: FLOMAX Take 1 capsule (0.4 mg total) by mouth daily.       Allergies:   Allergies  Allergen Reactions  . Bee Venom Anaphylaxis    Family History  Problem Relation Age of Onset  . Colon cancer Maternal Aunt     Social History:  reports that he has quit smoking. He has never used smokeless tobacco. He reports current alcohol use of about 4.0 standard drinks of alcohol per week. He reports that he does not use drugs.  ROS: A complete review of systems was performed.  All systems are negative except for pertinent findings as noted.  Physical Exam:  Vital signs in last 24 hours: There were no vitals taken for this visit. Constitutional:  Alert and oriented, No acute distress Cardiovascular: Regular rate  Respiratory: Normal respiratory effort GI: Abdomen is soft, nontender, nondistended, no abdominal masses. No CVAT.  Genitourinary: Normal male phallus, testes are descended bilaterally and non-tender and without masses, scrotum is normal in appearance without lesions or masses, perineum is normal on inspection.  Small hydroceles bilaterally.  Small nodularity lower aspect of right epididymis, nontender. Lymphatic: No lymphadenopathy Neurologic: Grossly intact, no focal deficits Psychiatric: Normal mood and affect     Impression/Assessment:  1.  Epididymal inflammation, resolved  2.  BPH, on finasteride and tamsulosin, urinary symptoms stable  Plan:  1.  Finasteride refilled  2.  Reassurance regarding exam  3.  Keep appointment scheduled for later this year

## 2020-05-04 ENCOUNTER — Ambulatory Visit (INDEPENDENT_AMBULATORY_CARE_PROVIDER_SITE_OTHER): Payer: Medicare Other | Admitting: Urology

## 2020-05-04 ENCOUNTER — Other Ambulatory Visit: Payer: Self-pay

## 2020-05-04 ENCOUNTER — Encounter: Payer: Self-pay | Admitting: Urology

## 2020-05-04 VITALS — BP 128/55 | HR 68 | Temp 97.5°F | Ht 68.5 in | Wt 156.0 lb

## 2020-05-04 DIAGNOSIS — N5089 Other specified disorders of the male genital organs: Secondary | ICD-10-CM

## 2020-05-04 DIAGNOSIS — N401 Enlarged prostate with lower urinary tract symptoms: Secondary | ICD-10-CM

## 2020-05-04 LAB — URINALYSIS, ROUTINE W REFLEX MICROSCOPIC
Bilirubin, UA: NEGATIVE
Glucose, UA: NEGATIVE
Ketones, UA: NEGATIVE
Leukocytes,UA: NEGATIVE
Nitrite, UA: NEGATIVE
Protein,UA: NEGATIVE
RBC, UA: NEGATIVE
Specific Gravity, UA: 1.025 (ref 1.005–1.030)
Urobilinogen, Ur: 1 mg/dL (ref 0.2–1.0)
pH, UA: 7.5 (ref 5.0–7.5)

## 2020-05-04 MED ORDER — FINASTERIDE 5 MG PO TABS: 5.0000 mg | ORAL_TABLET | Freq: Every day | ORAL | 3 refills | Status: DC

## 2020-05-04 NOTE — Progress Notes (Signed)
Urological Symptom Review  Patient is experiencing the following symptoms: None    Review of Systems  Gastrointestinal (upper)  : Negative for upper GI symptoms  Gastrointestinal (lower) : Negative for lower GI symptoms  Constitutional : Negative for symptoms  Skin: Negative for skin symptoms  Eyes: Blurred vision  Ear/Nose/Throat : Negative for Ear/Nose/Throat symptoms  Hematologic/Lymphatic: Negative for Hematologic/Lymphatic symptoms  Cardiovascular : Leg swelling  Respiratory : Negative for respiratory symptoms  Endocrine: Negative for endocrine symptoms  Musculoskeletal: Negative for musculoskeletal symptoms  Neurological: Negative for neurological symptoms  Psychologic: Negative for psychiatric symptoms

## 2020-05-11 ENCOUNTER — Encounter (INDEPENDENT_AMBULATORY_CARE_PROVIDER_SITE_OTHER): Payer: Medicare Other | Admitting: Ophthalmology

## 2020-05-18 ENCOUNTER — Ambulatory Visit: Payer: Medicare Other | Admitting: Urology

## 2020-06-15 ENCOUNTER — Encounter (INDEPENDENT_AMBULATORY_CARE_PROVIDER_SITE_OTHER): Payer: Medicare Other | Admitting: Ophthalmology

## 2020-06-18 DIAGNOSIS — Z23 Encounter for immunization: Secondary | ICD-10-CM | POA: Diagnosis not present

## 2020-07-01 ENCOUNTER — Encounter (INDEPENDENT_AMBULATORY_CARE_PROVIDER_SITE_OTHER): Payer: Self-pay | Admitting: Ophthalmology

## 2020-07-01 ENCOUNTER — Ambulatory Visit (INDEPENDENT_AMBULATORY_CARE_PROVIDER_SITE_OTHER): Payer: Medicare Other | Admitting: Ophthalmology

## 2020-07-01 ENCOUNTER — Other Ambulatory Visit: Payer: Self-pay

## 2020-07-01 DIAGNOSIS — H43812 Vitreous degeneration, left eye: Secondary | ICD-10-CM | POA: Insufficient documentation

## 2020-07-01 DIAGNOSIS — Z1283 Encounter for screening for malignant neoplasm of skin: Secondary | ICD-10-CM | POA: Diagnosis not present

## 2020-07-01 DIAGNOSIS — H43813 Vitreous degeneration, bilateral: Secondary | ICD-10-CM

## 2020-07-01 DIAGNOSIS — H35371 Puckering of macula, right eye: Secondary | ICD-10-CM

## 2020-07-01 DIAGNOSIS — D225 Melanocytic nevi of trunk: Secondary | ICD-10-CM | POA: Diagnosis not present

## 2020-07-01 DIAGNOSIS — I8310 Varicose veins of unspecified lower extremity with inflammation: Secondary | ICD-10-CM | POA: Diagnosis not present

## 2020-07-01 NOTE — Progress Notes (Signed)
07/01/2020     CHIEF COMPLAINT Patient presents for Retina Follow Up (6wk fu OU /Pt states, "I feel like my va is getting worse by the day. My va is very blurry and I am taking my vitamins but I see a rapid weakening of my eyes overall."/)   HISTORY OF PRESENT ILLNESS: Steve Hawkins is a 80 y.o. male who presents to the clinic today for:   HPI    Retina Follow Up    Diagnosis: Other   Laterality: right eye   Onset: 6 weeks ago   Duration: 6 weeks   Course: gradually worsening   Comments: 6wk fu OU  Pt states, "I feel like my va is getting worse by the day. My va is very blurry and I am taking my vitamins but I see a rapid weakening of my eyes overall."        Last edited by Kendra Opitz, COA on 07/01/2020  1:04 PM. (History)      Referring physician: Asencion Noble, MD [address] [place],  [place] [address]  HISTORICAL INFORMATION:   Selected notes from the [employer]: No current outpatient medications on file. (Ophthalmic Drugs)   No current facility-administered medications for this visit. (Ophthalmic Drugs)   Current Outpatient Medications (Other)  Medication Sig  . cholecalciferol (VITAMIN D3) 25 MCG (1000 UNIT) tablet Take 1,000 Units by mouth daily.  . finasteride (PROSCAR) 5 MG tablet Take 1 tablet (5 mg total) by mouth daily.  . sildenafil (VIAGRA) 100 MG tablet Take 1 tablet (100 mg total) by mouth daily as needed for erectile dysfunction.  . sulfamethoxazole-trimethoprim (BACTRIM DS) 800-160 MG tablet Take 1 tablet by mouth every 12 (twelve) hours.  . tamsulosin (FLOMAX) 0.4 MG CAPS capsule Take 1 capsule (0.4 mg total) by mouth daily.   No current facility-administered medications for this visit. (Other)      REVIEW OF SYSTEMS:    ALLERGIES Allergies  Allergen Reactions  . Bee Venom Anaphylaxis    PAST MEDICAL HISTORY Past Medical History:  Diagnosis Date  . Cancer ([place]) 12/2018   skin  cancer-melanoma  . Varicose veins    Past Surgical History:  Procedure Laterality Date  . CHOLECYSTECTOMY    . COLONOSCOPY N/A 04/23/2019   Procedure: COLONOSCOPY;  Surgeon: Rogene Houston, MD;  [facility];  Service: Endoscopy;  Laterality: N/A;  930  . ENDOVENOUS ABLATION SAPHENOUS VEIN W/ LASER    . ENDOVENOUS ABLATION SAPHENOUS VEIN W/ LASER Left 12-21-2014   endovenous laser ablation left small saphenous vein  by Dr. Tinnie Gens   . HEMORRHOID SURGERY    . POLYPECTOMY  04/23/2019   Procedure: POLYPECTOMY;  Surgeon: Rogene Houston, MD;  [facility];  Service: Endoscopy;;    FAMILY HISTORY Family History  Problem Relation Age of Onset  . Colon cancer Maternal Aunt     SOCIAL HISTORY Social History   Tobacco Use  . Smoking status: Former Research scientist (life sciences)  . Smokeless tobacco: Never Used  Vaping Use  . Vaping Use: Never used  Substance Use Topics  . Alcohol use: Yes    Alcohol/week: 4.0 standard drinks    Types: 1 Cans of beer, 1 Shots of liquor, 2 Standard drinks or equivalent per week  . Drug use: No         OPHTHALMIC EXAM: Base Eye Exam    Visual Acuity (ETDRS)  Right Left   Dist [place] 20/50 20/50   Dist ph [place] NI 20/40 -2       Tonometry (Tonopen, 1:09 PM)      Right Left   Pressure 14 18       Pupils      Pupils Dark Light Shape React APD   Right PERRL 3 2 Round Slow None   Left PERRL 3 2 Round Slow None       Visual Fields (Counting fingers)      Left Right    Full Full       Extraocular Movement      Right Left    Full Full       Neuro/Psych    Oriented x3: Yes       Dilation    Both eyes: 1.0% Mydriacyl, 2.5% Phenylephrine @ 1:09 PM        Slit Lamp and Fundus Exam    External Exam      Right Left   External Normal Normal       Slit Lamp Exam      Right Left   Lids/Lashes Normal Normal   Conjunctiva/Sclera White and quiet White and quiet   Cornea Clear Clear   Anterior Chamber Deep and quiet Deep and  quiet   Iris Round and reactive Round and reactive   Lens Centered posterior chamber intraocular lens, multifocal Centered posterior chamber intraocular lens, multifocal   Anterior Vitreous Normal Normal       Fundus Exam      Right Left   Posterior Vitreous Posterior vitreous detachment Posterior vitreous detachment   Disc Normal Normal   C/D Ratio 0.15 0.2   Macula Epiretinal membrane, mod topo distortion, Hard drusen, no hemorrhage Soft drusen, no hemorrhage, no macular thickening   Vessels Normal Normal   Periphery Normal Normal          IMAGING AND PROCEDURES  Imaging and Procedures for 07/01/20  OCT, Retina - OU - Both Eyes       Right Eye Quality was good. Scan locations included subfoveal. Central Foveal Thickness: 375. Progression has no prior data. Findings include abnormal foveal contour, epiretinal membrane, retinal drusen , no IRF, no SRF.   Left Eye Quality was good. Scan locations included subfoveal. Central Foveal Thickness: 299. Progression has no prior data. Findings include retinal drusen , pigment epithelial detachment, no IRF, no SRF.   Notes Incidental posterior vitreous detachment.  OU.  Epiretinal membrane with retinal thickening and elevation of the center foveal depression.  OD.  OS with drusenoid pigment epithelial detachments, no signs of active CNVM                ASSESSMENT/PLAN:  Posterior vitreous detachment, both eyes No holes or tears OU yet patient does experience floaters  I did explain to the patient as a potential benefit of surgical intervention for the epiretinal membrane in the right eye, the floaters with the vitrectomy will be mostly removed  Macular pucker, right eye Visual acuity impact continues from epiretinal membrane with topographic distortion of the fovea as well as significant thickening Of the central foveal region more pronounced than it symptoms because the patient has a multifocal IOL in place       ICD-10-CM   1. Macular pucker, right eye  H35.371 OCT, Retina - OU - Both Eyes  2. Posterior vitreous detachment, both eyes  H43.813     1.  Patient continues to have significant symptomatic difficulties  with fine visual acuity particularly reading up close to the right eye that is coincident and causative from epiretinal membrane with foveal thickening on the right eye.  2.  I explained the typical course of surgery and typical course of postop care and healing process of visual acuity taking some weeks to months.  3.  Ophthalmic Meds Ordered this visit:  No orders of the defined types were placed in this encounter.      Return [place], [facility], for Schedule vitrectomy membrane [phone], OD.  There are no Patient Instructions on file for this visit.   Explained the diagnoses, plan, and follow up with the patient and they expressed understanding.  Patient expressed understanding of the importance of proper follow up care.   Clent Demark Max Nuno M.D. Diseases & Surgery of the Retina and Vitreous [facility]ina [facility] Diabetic [place] 07/01/20     Abbreviations: M myopia (nearsighted); A astigmatism; H hyperopia (farsighted); P presbyopia; Mrx spectacle prescription;  CTL contact lenses; OD right eye; OS left eye; OU both eyes  XT exotropia; ET esotropia; PEK punctate epithelial keratitis; PEE punctate epithelial erosions; DES dry eye syndrome; MGD meibomian gland dysfunction; ATs artificial tears; PFAT's preservative free artificial tears; [place] nuclear sclerotic cataract; PSC posterior subcapsular cataract; ERM epi-retinal membrane; PVD posterior vitreous detachment; RD retinal detachment; DM diabetes mellitus; DR diabetic retinopathy; NPDR non-proliferative diabetic retinopathy; PDR proliferative diabetic retinopathy; CSME clinically significant macular edema; DME diabetic macular edema; dbh dot blot hemorrhages; CWS cotton wool spot; POAG primary open angle glaucoma; C/D  cup-to-disc ratio; HVF humphrey visual field; GVF goldmann visual field; OCT optical coherence tomography; IOP intraocular pressure; BRVO Branch retinal vein occlusion; CRVO central retinal vein occlusion; CRAO central retinal artery occlusion; BRAO branch retinal artery occlusion; RT retinal tear; SB scleral buckle; PPV pars plana vitrectomy; VH Vitreous hemorrhage; PRP panretinal laser photocoagulation; IVK intravitreal kenalog; VMT vitreomacular traction; MH Macular hole;  NVD neovascularization of the disc; NVE neovascularization elsewhere; AREDS age related eye disease study; ARMD age related macular degeneration; POAG primary open angle glaucoma; EBMD epithelial/anterior basement membrane dystrophy; ACIOL anterior chamber intraocular lens; IOL intraocular lens; PCIOL posterior chamber intraocular lens; Phaco/IOL phacoemulsification with intraocular lens placement; [place] photorefractive keratectomy; LASIK laser assisted in situ keratomileusis; HTN hypertension; DM diabetes mellitus; COPD chronic obstructive pulmonary disease

## 2020-07-01 NOTE — Assessment & Plan Note (Signed)
No holes or tears OU yet patient does experience floaters  I did explain to the patient as a potential benefit of surgical intervention for the epiretinal membrane in the right eye, the floaters with the vitrectomy will be mostly removed

## 2020-07-01 NOTE — Assessment & Plan Note (Signed)
Visual acuity impact continues from epiretinal membrane with topographic distortion of the fovea as well as significant thickening Of the central foveal region more pronounced than it symptoms because the patient has a multifocal IOL in place

## 2020-07-21 ENCOUNTER — Ambulatory Visit (INDEPENDENT_AMBULATORY_CARE_PROVIDER_SITE_OTHER): Payer: Medicare Other

## 2020-07-21 ENCOUNTER — Other Ambulatory Visit: Payer: Self-pay

## 2020-07-21 DIAGNOSIS — H35371 Puckering of macula, right eye: Secondary | ICD-10-CM

## 2020-07-21 MED ORDER — OFLOXACIN 0.3 % OP SOLN: 1.0000 [drp] | Freq: Four times a day (QID) | OPHTHALMIC | 0 refills | Status: AC

## 2020-07-21 MED ORDER — PREDNISOLONE ACETATE 1 % OP SUSP: 1.0000 [drp] | Freq: Four times a day (QID) | OPHTHALMIC | 0 refills | Status: AC

## 2020-07-21 NOTE — Progress Notes (Signed)
07/21/2020     CHIEF COMPLAINT Patient presents for Pre-op Exam (Pre- op - Vitrectomy OD- 07/28/2020)   HISTORY OF PRESENT ILLNESS: Steve Hawkins is a 80 y.o. male who presents to the clinic today for:   HPI    Pre-op Exam     Additional comments: Pre- op - Vitrectomy OD- 07/28/2020       Last edited by Kendra Opitz, COA on 07/21/2020  2:18 PM. (History)        HISTORICAL INFORMATION:   Selected notes from the MEDICAL RECORD NUMBER       CURRENT MEDICATIONS: Current Outpatient Medications (Ophthalmic Drugs)  Medication Sig  . ofloxacin (OCUFLOX) 0.3 % ophthalmic solution Place 1 drop into the right eye 4 (four) times daily for 21 days.  . prednisoLONE acetate (PRED FORTE) 1 % ophthalmic suspension Place 1 drop into the right eye 4 (four) times daily for 21 days.   No current facility-administered medications for this visit. (Ophthalmic Drugs)   Current Outpatient Medications (Other)  Medication Sig  . cholecalciferol (VITAMIN D3) 25 MCG (1000 UNIT) tablet Take 1,000 Units by mouth daily.  . finasteride (PROSCAR) 5 MG tablet Take 1 tablet (5 mg total) by mouth daily.  . sildenafil (VIAGRA) 100 MG tablet Take 1 tablet (100 mg total) by mouth daily as needed for erectile dysfunction.  . sulfamethoxazole-trimethoprim (BACTRIM DS) 800-160 MG tablet Take 1 tablet by mouth every 12 (twelve) hours.  . tamsulosin (FLOMAX) 0.4 MG CAPS capsule Take 1 capsule (0.4 mg total) by mouth daily.   No current facility-administered medications for this visit. (Other)     ALLERGIES Allergies  Allergen Reactions  . Bee Venom Anaphylaxis    PAST MEDICAL HISTORY Past Medical History:  Diagnosis Date  . Cancer ([place]) 12/2018   skin cancer-melanoma  . Varicose veins    Past Surgical History:  Procedure Laterality Date  . CHOLECYSTECTOMY    . COLONOSCOPY N/A 04/23/2019   Procedure: COLONOSCOPY;  Surgeon: Rogene Houston, MD;  [facility];  Service: Endoscopy;   Laterality: N/A;  930  . ENDOVENOUS ABLATION SAPHENOUS VEIN W/ LASER    . ENDOVENOUS ABLATION SAPHENOUS VEIN W/ LASER Left 12-21-2014   endovenous laser ablation left small saphenous vein  by Dr. Tinnie Gens   . HEMORRHOID SURGERY    . POLYPECTOMY  04/23/2019   Procedure: POLYPECTOMY;  Surgeon: Rogene Houston, MD;  [facility];  Service: Endoscopy;;    FAMILY HISTORY Family History  Problem Relation Age of Onset  . Colon cancer Maternal Aunt     SOCIAL HISTORY Social History   Tobacco Use  . Smoking status: Former Research scientist (life sciences)  . Smokeless tobacco: Never Used  Vaping Use  . Vaping Use: Never used  Substance Use Topics  . Alcohol use: Yes    Alcohol/week: 4.0 standard drinks    Types: 1 Cans of beer, 1 Shots of liquor, 2 Standard drinks or equivalent per week  . Drug use: No         OPHTHALMIC EXAM:  Base Eye Exam    Visual Acuity (ETDRS)      Right Left   Dist [place] 20/50 20/50 +1   Dist ph [place] NI 20/40       Tonometry      Right Left   Pressure 13           IMAGING AND PROCEDURES  Imaging and Procedures for @TODAY @  ASSESSMENT/PLAN:  No diagnosis found.  Ophthalmic Meds Ordered this visit:  Meds ordered this encounter  Medications  . prednisoLONE acetate (PRED FORTE) 1 % ophthalmic suspension    Sig: Place 1 drop into the right eye 4 (four) times daily for 21 days.    Dispense:  5 mL    Refill:  0  . ofloxacin (OCUFLOX) 0.3 % ophthalmic solution    Sig: Place 1 drop into the right eye 4 (four) times daily for 21 days.    Dispense:  5 mL    Refill:  0        Pre-op completed. Operative consent obtained with pre-op eye drops reviewed with [name] and sent via [facility] as needed. Post op instructions reviewed with patient and per patient all questions answered.  [place], COA

## 2020-07-28 ENCOUNTER — Encounter (AMBULATORY_SURGERY_CENTER): Payer: Medicare Other | Admitting: Ophthalmology

## 2020-07-28 DIAGNOSIS — H35371 Puckering of macula, right eye: Secondary | ICD-10-CM

## 2020-07-29 ENCOUNTER — Other Ambulatory Visit: Payer: Self-pay

## 2020-07-29 ENCOUNTER — Encounter (INDEPENDENT_AMBULATORY_CARE_PROVIDER_SITE_OTHER): Payer: Self-pay | Admitting: Ophthalmology

## 2020-07-29 ENCOUNTER — Ambulatory Visit (INDEPENDENT_AMBULATORY_CARE_PROVIDER_SITE_OTHER): Payer: Medicare Other | Admitting: Ophthalmology

## 2020-07-29 DIAGNOSIS — H35371 Puckering of macula, right eye: Secondary | ICD-10-CM

## 2020-07-29 NOTE — Progress Notes (Signed)
07/29/2020     CHIEF COMPLAINT Patient presents for Post-op Follow-up (1 day post op od sx 07/28/2020/Pt states, "I have not had any issues that I can tell. No pain or discomfort.")   HISTORY OF PRESENT ILLNESS: Steve Hawkins is a 80 y.o. male who presents to the clinic today for:   HPI    Post-op Follow-up    Laterality: right eye   Discomfort: Negative for pain, itching and foreign body sensation   Comments: 1 day post op od sx 07/28/2020 Pt states, "I have not had any issues that I can tell. No pain or discomfort."       Last edited by Kendra Opitz, COA on 07/29/2020  8:48 AM. (History)      Referring physician: Asencion Noble, MD [address] [place],  [place] [address]  HISTORICAL INFORMATION:   Selected notes from the MEDICAL RECORD NUMBER       CURRENT MEDICATIONS: Current Outpatient Medications (Ophthalmic Drugs)  Medication Sig  . ofloxacin (OCUFLOX) 0.3 % ophthalmic solution Place 1 drop into the right eye 4 (four) times daily for 21 days.  . prednisoLONE acetate (PRED FORTE) 1 % ophthalmic suspension Place 1 drop into the right eye 4 (four) times daily for 21 days.   No current facility-administered medications for this visit. (Ophthalmic Drugs)   Current Outpatient Medications (Other)  Medication Sig  . cholecalciferol (VITAMIN D3) 25 MCG (1000 UNIT) tablet Take 1,000 Units by mouth daily.  . finasteride (PROSCAR) 5 MG tablet Take 1 tablet (5 mg total) by mouth daily.  . sildenafil (VIAGRA) 100 MG tablet Take 1 tablet (100 mg total) by mouth daily as needed for erectile dysfunction.  . sulfamethoxazole-trimethoprim (BACTRIM DS) 800-160 MG tablet Take 1 tablet by mouth every 12 (twelve) hours.  . tamsulosin (FLOMAX) 0.4 MG CAPS capsule Take 1 capsule (0.4 mg total) by mouth daily.   No current facility-administered medications for this visit. (Other)      REVIEW OF SYSTEMS:    ALLERGIES Allergies  Allergen Reactions  . Bee Venom  Anaphylaxis    PAST MEDICAL HISTORY Past Medical History:  Diagnosis Date  . Cancer ([place]) 12/2018   skin cancer-melanoma  . Varicose veins    Past Surgical History:  Procedure Laterality Date  . CHOLECYSTECTOMY    . COLONOSCOPY N/A 04/23/2019   Procedure: COLONOSCOPY;  Surgeon: Rogene Houston, MD;  [facility];  Service: Endoscopy;  Laterality: N/A;  930  . ENDOVENOUS ABLATION SAPHENOUS VEIN W/ LASER    . ENDOVENOUS ABLATION SAPHENOUS VEIN W/ LASER Left 12-21-2014   endovenous laser ablation left small saphenous vein  by Dr. Tinnie Gens   . HEMORRHOID SURGERY    . POLYPECTOMY  04/23/2019   Procedure: POLYPECTOMY;  Surgeon: Rogene Houston, MD;  [facility];  Service: Endoscopy;;    FAMILY HISTORY Family History  Problem Relation Age of Onset  . Colon cancer Maternal Aunt     SOCIAL HISTORY Social History   Tobacco Use  . Smoking status: Former Research scientist (life sciences)  . Smokeless tobacco: Never Used  Vaping Use  . Vaping Use: Never used  Substance Use Topics  . Alcohol use: Yes    Alcohol/week: 4.0 standard drinks    Types: 1 Cans of beer, 1 Shots of liquor, 2 Standard drinks or equivalent per week  . Drug use: No         OPHTHALMIC EXAM:  Base Eye Exam    Visual  Acuity (ETDRS)      Right Left   Dist [place] 20/80 -1 20/40   Dist ph [place] 20/60 NI       Tonometry (Tonopen, 8:53 AM)      Right Left   Pressure 11 17       Pupils      Pupils Dark Light Shape React APD   Right PERRL 5 5 Round Dilated    Left PERRL 3 2 Round Slow None       Neuro/Psych    Oriented x3: Yes       Dilation    Right eye: 1.0% Mydriacyl, 2.5% Phenylephrine @ 8:53 AM        Slit Lamp and Fundus Exam    External Exam      Right Left   External Normal Normal       Slit Lamp Exam      Right Left   Lids/Lashes Normal Normal   Conjunctiva/Sclera White and quiet White and quiet   Cornea Clear Clear   Anterior Chamber Deep and quiet Deep and quiet   Iris Round  and reactive Round and reactive   Lens Centered posterior chamber intraocular lens, multifocal Centered posterior chamber intraocular lens, multifocal   Anterior Vitreous Normal Normal       Fundus Exam      Right Left   Posterior Vitreous Clear, avitric    Disc Normal    C/D Ratio 0.15 0.2   Macula No topographic distortion, post ILM removal type whitening changes    Vessels Normal    Periphery Normal           IMAGING AND PROCEDURES  Imaging and Procedures for 07/29/20           ASSESSMENT/PLAN:  Macular pucker, right eye Postop day #1 looks great OD Ofloxacin  4 times daily to the operative eye  Prednisolone acetate 1 drop to the operative eye 4 times daily  Patient instructed not to refill the medications and use them for maximum of 3 weeks.  Patient instructed do not rub the eye.  Patient has the option to use the patch at night.      ICD-10-CM   1. Macular pucker, right eye  H35.371     1.  Patient encouraged not to mash, compress or rub the eye. 2.  Patient instructed and restricted activities for the first 10 days no heavy lifting bending straining including treat constipation.  Patient also informed not to intentionally increase heart rate activities  3.  Ophthalmic Meds Ordered this visit:  No orders of the defined types were placed in this encounter.      Return for RV 5 to 7 days, dilate, OS, OCT, POST OP.  Patient Instructions  Ofloxacin  4 times daily to the operative eye  Prednisolone acetate 1 drop to the operative eye 4 times daily  Patient instructed not to refill the medications and use them for maximum of 3 weeks.  Patient instructed do not rub the eye.  Patient has the option to use the patch at night.    Explained the diagnoses, plan, and follow up with the patient and they expressed understanding.  Patient expressed understanding of the importance of proper follow up care.   Clent Demark Chavon Lucarelli M.D. Diseases & Surgery of the  Retina and Vitreous Retina & Diabetic [place] 07/29/20     Abbreviations: M myopia (nearsighted); A astigmatism; H hyperopia (farsighted); P presbyopia; Mrx spectacle prescription;  CTL contact  lenses; OD right eye; OS left eye; OU both eyes  XT exotropia; ET esotropia; PEK punctate epithelial keratitis; PEE punctate epithelial erosions; DES dry eye syndrome; MGD meibomian gland dysfunction; ATs artificial tears; PFAT's preservative free artificial tears; [place] nuclear sclerotic cataract; PSC posterior subcapsular cataract; ERM epi-retinal membrane; PVD posterior vitreous detachment; RD retinal detachment; DM diabetes mellitus; DR diabetic retinopathy; NPDR non-proliferative diabetic retinopathy; PDR proliferative diabetic retinopathy; CSME clinically significant macular edema; DME diabetic macular edema; dbh dot blot hemorrhages; CWS cotton wool spot; POAG primary open angle glaucoma; C/D cup-to-disc ratio; HVF humphrey visual field; GVF goldmann visual field; OCT optical coherence tomography; IOP intraocular pressure; BRVO Branch retinal vein occlusion; CRVO central retinal vein occlusion; CRAO central retinal artery occlusion; BRAO branch retinal artery occlusion; RT retinal tear; SB scleral buckle; PPV pars plana vitrectomy; VH Vitreous hemorrhage; PRP panretinal laser photocoagulation; IVK intravitreal kenalog; VMT vitreomacular traction; MH Macular hole;  NVD neovascularization of the disc; NVE neovascularization elsewhere; AREDS age related eye disease study; ARMD age related macular degeneration; POAG primary open angle glaucoma; EBMD epithelial/anterior basement membrane dystrophy; ACIOL anterior chamber intraocular lens; IOL intraocular lens; PCIOL posterior chamber intraocular lens; Phaco/IOL phacoemulsification with intraocular lens placement; [place] photorefractive keratectomy; LASIK laser assisted in situ keratomileusis; HTN hypertension; DM diabetes mellitus; COPD chronic obstructive pulmonary  disease

## 2020-07-29 NOTE — Patient Instructions (Signed)
Ofloxacin  4 times daily to the operative eye  Prednisolone acetate 1 drop to the operative eye 4 times daily  Patient instructed not to refill the medications and use them for maximum of 3 weeks.  Patient instructed do not rub the eye.  Patient has the option to use the patch at night. 

## 2020-07-29 NOTE — Assessment & Plan Note (Addendum)
Postop day #1 looks great OD Ofloxacin  4 times daily to the operative eye  Prednisolone acetate 1 drop to the operative eye 4 times daily  Patient instructed not to refill the medications and use them for maximum of 3 weeks.  Patient instructed do not rub the eye.  Patient has the option to use the patch at night.

## 2020-08-03 ENCOUNTER — Encounter (INDEPENDENT_AMBULATORY_CARE_PROVIDER_SITE_OTHER): Payer: Self-pay | Admitting: Ophthalmology

## 2020-08-03 ENCOUNTER — Other Ambulatory Visit: Payer: Self-pay

## 2020-08-03 ENCOUNTER — Ambulatory Visit (INDEPENDENT_AMBULATORY_CARE_PROVIDER_SITE_OTHER): Payer: Medicare Other | Admitting: Ophthalmology

## 2020-08-03 DIAGNOSIS — H35371 Puckering of macula, right eye: Secondary | ICD-10-CM | POA: Diagnosis not present

## 2020-08-03 NOTE — Progress Notes (Signed)
08/03/2020     CHIEF COMPLAINT Patient presents for Post-op Follow-up (1 Week POV OD//Pt denies ocular pain. Pt reports improved VA OD. Pt sts he is still wearing the eye patch at night OD. VA stable OS.)   HISTORY OF PRESENT ILLNESS: Steve Hawkins is a 80 y.o. male who presents to the clinic today for:   HPI    Post-op Follow-up    Laterality: right eye   Vision: is improved   Comments: 1 Week POV OD  Pt denies ocular pain. Pt reports improved VA OD. Pt sts he is still wearing the eye patch at night OD. VA stable OS.          Comments    OD, 1 week post vitrectomy membrane peel, now able to see better to read up close as the patient reports today       Last edited by Hurman Horn, MD on 08/03/2020  5:05 PM. (History)      Referring physician: Asencion Noble, MD [address] [place],  [place] [address]  HISTORICAL INFORMATION:   Selected notes from the [place]: Current Outpatient Medications (Ophthalmic Drugs)  Medication Sig  . ofloxacin (OCUFLOX) 0.3 % ophthalmic solution Place 1 drop into the right eye 4 (four) times daily for 21 days.  . prednisoLONE acetate (PRED FORTE) 1 % ophthalmic suspension Place 1 drop into the right eye 4 (four) times daily for 21 days.   No current facility-administered medications for this visit. (Ophthalmic Drugs)   Current Outpatient Medications (Other)  Medication Sig  . cholecalciferol (VITAMIN D3) 25 MCG (1000 UNIT) tablet Take 1,000 Units by mouth daily.  . finasteride (PROSCAR) 5 MG tablet Take 1 tablet (5 mg total) by mouth daily.  . sildenafil (VIAGRA) 100 MG tablet Take 1 tablet (100 mg total) by mouth daily as needed for erectile dysfunction.  . sulfamethoxazole-trimethoprim (BACTRIM DS) 800-160 MG tablet Take 1 tablet by mouth every 12 (twelve) hours.  . tamsulosin (FLOMAX) 0.4 MG CAPS capsule Take 1 capsule (0.4 mg total) by mouth daily.   No current facility-administered  medications for this visit. (Other)      REVIEW OF SYSTEMS:    ALLERGIES Allergies  Allergen Reactions  . Bee Venom Anaphylaxis    PAST MEDICAL HISTORY Past Medical History:  Diagnosis Date  . Cancer ([place]) 12/2018   skin cancer-melanoma  . Varicose veins    Past Surgical History:  Procedure Laterality Date  . CHOLECYSTECTOMY    . COLONOSCOPY N/A 04/23/2019   Procedure: COLONOSCOPY;  Surgeon: Rogene Houston, MD;  [facility];  Service: Endoscopy;  Laterality: N/A;  930  . ENDOVENOUS ABLATION SAPHENOUS VEIN W/ LASER    . ENDOVENOUS ABLATION SAPHENOUS VEIN W/ LASER Left 12-21-2014   endovenous laser ablation left small saphenous vein  by Dr. Tinnie Gens   . HEMORRHOID SURGERY    . POLYPECTOMY  04/23/2019   Procedure: POLYPECTOMY;  Surgeon: Rogene Houston, MD;  [facility];  Service: Endoscopy;;    FAMILY HISTORY Family History  Problem Relation Age of Onset  . Colon cancer Maternal Aunt     SOCIAL HISTORY Social History   Tobacco Use  . Smoking status: Former Research scientist (life sciences)  . Smokeless tobacco: Never Used  Vaping Use  . Vaping Use: Never used  Substance Use Topics  . Alcohol use: Yes    Alcohol/week: 4.0 standard drinks  Types: 1 Cans of beer, 1 Shots of liquor, 2 Standard drinks or equivalent per week  . Drug use: No         OPHTHALMIC EXAM: Base Eye Exam    Visual Acuity (ETDRS)      Right Left   Dist [place] 20/40 +2 20/30 +1   Dist ph  20/30 -1 20/30 +2       Tonometry (Tonopen, 3:52 PM)      Right Left   Pressure 15 18       Pupils      Pupils Dark Light Shape React APD   Right PERRL 4 3 Round Brisk None   Left PERRL 4 3 Round Brisk None       Visual Fields (Counting fingers)      Left Right    Full Full       Extraocular Movement      Right Left    Full Full       Neuro/Psych    Oriented x3: Yes   Mood/Affect: Normal       Dilation    Right eye: 1.0% Mydriacyl, 2.5% Phenylephrine @ 3:53 PM         Slit Lamp and Fundus Exam    External Exam      Right Left   External Normal Normal       Slit Lamp Exam      Right Left   Lids/Lashes Normal Normal   Conjunctiva/Sclera White and quiet White and quiet   Cornea Clear Clear   Anterior Chamber Deep and quiet Deep and quiet   Iris Round and reactive Round and reactive   Lens Centered posterior chamber intraocular lens, multifocal Centered posterior chamber intraocular lens, multifocal   Anterior Vitreous Normal Normal       Fundus Exam      Right Left   Posterior Vitreous Clear, avitric    Disc Normal    C/D Ratio 0.15    Macula No topographic distortion, post ILM removal type whitening changes    Vessels Normal    Periphery Normal           IMAGING AND PROCEDURES  Imaging and Procedures for 08/03/20  OCT, Retina - OU - Both Eyes       Right Eye Quality was good. Scan locations included subfoveal. Central Foveal Thickness: 359. Progression has no prior data. Findings include abnormal foveal contour, epiretinal membrane, retinal drusen , no IRF, no SRF.   Left Eye Quality was good. Scan locations included subfoveal. Central Foveal Thickness: 307. Progression has no prior data. Findings include retinal drusen , pigment epithelial detachment, no IRF, no SRF.   Notes Incidental posterior vitreous detachment.  OS  Post vitrectomy membrane peel OD, ILM now removed.  Less retinal thickening 1 week postop.  OS with drusenoid pigment epithelial detachments, no signs of active CNVM                ASSESSMENT/PLAN:  Macular pucker, right eye 1 week post vitrectomy membrane peel right eye with improving acuity symptomatically.  OCT also improved appearance.  Patient instructed to complete drops over the next 2 weeks or discontinue them if any medication is remains at the end of 2 more weeks.  I have also instructed patient not to refill the current bottles should they be completed prior to 2 weeks from today       ICD-10-CM   1. Macular pucker, right eye  H35.371 OCT, Retina - OU -  Both Eyes    1.  OD looks great, healing very nicely post surgery.  2.  Patient is is very able to return to full activity with the exception of boxing or mashing rubbing the eye and 3 more days.  3.  Ophthalmic Meds Ordered this visit:  No orders of the defined types were placed in this encounter.      Return in about 8 weeks (around 09/28/2020) for dilate, OD, OCT.  There are no Patient Instructions on file for this visit.   Explained the diagnoses, plan, and follow up with the patient and they expressed understanding.  Patient expressed understanding of the importance of proper follow up care.   Clent Demark Harinder Romas M.D. Diseases & Surgery of the Retina and Vitreous Retina & Diabetic [place] 08/03/20     Abbreviations: M myopia (nearsighted); A astigmatism; H hyperopia (farsighted); P presbyopia; Mrx spectacle prescription;  CTL contact lenses; OD right eye; OS left eye; OU both eyes  XT exotropia; ET esotropia; PEK punctate epithelial keratitis; PEE punctate epithelial erosions; DES dry eye syndrome; MGD meibomian gland dysfunction; ATs artificial tears; PFAT's preservative free artificial tears; [place] nuclear sclerotic cataract; PSC posterior subcapsular cataract; ERM epi-retinal membrane; PVD posterior vitreous detachment; RD retinal detachment; DM diabetes mellitus; DR diabetic retinopathy; NPDR non-proliferative diabetic retinopathy; PDR proliferative diabetic retinopathy; CSME clinically significant macular edema; DME diabetic macular edema; dbh dot blot hemorrhages; CWS cotton wool spot; POAG primary open angle glaucoma; C/D cup-to-disc ratio; HVF humphrey visual field; GVF goldmann visual field; OCT optical coherence tomography; IOP intraocular pressure; BRVO Branch retinal vein occlusion; CRVO central retinal vein occlusion; CRAO central retinal artery occlusion; BRAO branch retinal artery occlusion; RT retinal  tear; SB scleral buckle; PPV pars plana vitrectomy; VH Vitreous hemorrhage; PRP panretinal laser photocoagulation; IVK intravitreal kenalog; VMT vitreomacular traction; MH Macular hole;  NVD neovascularization of the disc; NVE neovascularization elsewhere; AREDS age related eye disease study; ARMD age related macular degeneration; POAG primary open angle glaucoma; EBMD epithelial/anterior basement membrane dystrophy; ACIOL anterior chamber intraocular lens; IOL intraocular lens; PCIOL posterior chamber intraocular lens; Phaco/IOL phacoemulsification with intraocular lens placement; [place] photorefractive keratectomy; LASIK laser assisted in situ keratomileusis; HTN hypertension; DM diabetes mellitus; COPD chronic obstructive pulmonary disease

## 2020-08-03 NOTE — Assessment & Plan Note (Signed)
1 week post vitrectomy membrane peel right eye with improving acuity symptomatically.  OCT also improved appearance.  Patient instructed to complete drops over the next 2 weeks or discontinue them if any medication is remains at the end of 2 more weeks.  I have also instructed patient not to refill the current bottles should they be completed prior to 2 weeks from today

## 2020-08-06 DIAGNOSIS — Z79899 Other long term (current) drug therapy: Secondary | ICD-10-CM | POA: Diagnosis not present

## 2020-08-06 DIAGNOSIS — Z0001 Encounter for general adult medical examination with abnormal findings: Secondary | ICD-10-CM | POA: Diagnosis not present

## 2020-08-06 DIAGNOSIS — I10 Essential (primary) hypertension: Secondary | ICD-10-CM | POA: Diagnosis not present

## 2020-08-19 DIAGNOSIS — R001 Bradycardia, unspecified: Secondary | ICD-10-CM | POA: Diagnosis not present

## 2020-08-19 DIAGNOSIS — Z85820 Personal history of malignant melanoma of skin: Secondary | ICD-10-CM | POA: Diagnosis not present

## 2020-08-19 DIAGNOSIS — N390 Urinary tract infection, site not specified: Secondary | ICD-10-CM | POA: Diagnosis not present

## 2020-08-19 DIAGNOSIS — Z6824 Body mass index (BMI) 24.0-24.9, adult: Secondary | ICD-10-CM | POA: Diagnosis not present

## 2020-08-19 DIAGNOSIS — R8281 Pyuria: Secondary | ICD-10-CM | POA: Diagnosis not present

## 2020-09-28 ENCOUNTER — Encounter (INDEPENDENT_AMBULATORY_CARE_PROVIDER_SITE_OTHER): Payer: Medicare Other | Admitting: Ophthalmology

## 2020-09-30 DIAGNOSIS — Z1283 Encounter for screening for malignant neoplasm of skin: Secondary | ICD-10-CM | POA: Diagnosis not present

## 2020-09-30 DIAGNOSIS — Z08 Encounter for follow-up examination after completed treatment for malignant neoplasm: Secondary | ICD-10-CM | POA: Diagnosis not present

## 2020-09-30 DIAGNOSIS — Z85820 Personal history of malignant melanoma of skin: Secondary | ICD-10-CM | POA: Diagnosis not present

## 2020-10-13 ENCOUNTER — Other Ambulatory Visit: Payer: Self-pay

## 2020-10-13 ENCOUNTER — Ambulatory Visit (INDEPENDENT_AMBULATORY_CARE_PROVIDER_SITE_OTHER): Payer: Medicare Other | Admitting: Ophthalmology

## 2020-10-13 ENCOUNTER — Encounter (INDEPENDENT_AMBULATORY_CARE_PROVIDER_SITE_OTHER): Payer: Self-pay | Admitting: Ophthalmology

## 2020-10-13 DIAGNOSIS — H35371 Puckering of macula, right eye: Secondary | ICD-10-CM

## 2020-10-13 DIAGNOSIS — H353132 Nonexudative age-related macular degeneration, bilateral, intermediate dry stage: Secondary | ICD-10-CM

## 2020-10-13 NOTE — Assessment & Plan Note (Signed)
No sign no signs of complication OU

## 2020-10-13 NOTE — Progress Notes (Signed)
10/13/2020     CHIEF COMPLAINT Patient presents for follow-up 8 weeks post vitrectomy membrane peel right eye for epiretinal membrane.  HISTORY OF PRESENT ILLNESS: Steve Hawkins is a 80 y.o. male who presents to the clinic today for:   HPI     Post-op Follow-up           Laterality: right eye   Discomfort: Negative for pain and itching   Vision: is improved   Comments: 1 Week POV OD  Pt denies ocular pain. Pt reports improved VA OD. Pt sts he is still wearing the eye patch at night OD. VA stable OS.         Comments   8 wk PO OD sx 07/28/2020 Patient states vision is stable and unchanged since last visit. Denies any new floaters or FOL.  Pt. states "I am wondering if the wrinkle is still there and I am wondering if there is any change to the macular degeneration."      Last edited by Laurin Coder, COA on 10/13/2020  2:10 PM.      Referring physician: Rutherford Guys, [place],   [id number]  HISTORICAL INFORMATION:   Selected notes from the [facility]: No current outpatient medications on file. (Ophthalmic Drugs)   No current facility-administered medications for this visit. (Ophthalmic Drugs)   Current Outpatient Medications (Other)  Medication Sig   cholecalciferol (VITAMIN D3) 25 MCG (1000 UNIT) tablet Take 1,000 Units by mouth daily.   finasteride (PROSCAR) 5 MG tablet Take 1 tablet (5 mg total) by mouth daily.   sildenafil (VIAGRA) 100 MG tablet Take 1 tablet (100 mg total) by mouth daily as needed for erectile dysfunction.   sulfamethoxazole-trimethoprim (BACTRIM DS) 800-160 MG tablet Take 1 tablet by mouth every 12 (twelve) hours.   tamsulosin (FLOMAX) 0.4 MG CAPS capsule Take 1 capsule (0.4 mg total) by mouth daily.   No current facility-administered medications for this visit. (Other)      REVIEW OF SYSTEMS:    ALLERGIES Allergies  Allergen Reactions   Bee Venom Anaphylaxis     PAST MEDICAL HISTORY Past Medical History:  Diagnosis Date   Cancer ([place]) 12/2018   skin cancer-melanoma   Varicose veins    Past Surgical History:  Procedure Laterality Date   CHOLECYSTECTOMY     COLONOSCOPY N/A 04/23/2019   Procedure: COLONOSCOPY;  Surgeon: Rogene Houston, MD;  [facility];  Service: Endoscopy;  Laterality: N/A;  930   ENDOVENOUS ABLATION SAPHENOUS VEIN W/ LASER     ENDOVENOUS ABLATION SAPHENOUS VEIN W/ LASER Left 12-21-2014   endovenous laser ablation left small saphenous vein  by Dr. Tinnie Gens    HEMORRHOID SURGERY     POLYPECTOMY  04/23/2019   Procedure: POLYPECTOMY;  Surgeon: Rogene Houston, MD;  [facility];  Service: Endoscopy;;    FAMILY HISTORY Family History  Problem Relation Age of Onset   Colon cancer Maternal Aunt     SOCIAL HISTORY Social History   Tobacco Use   Smoking status: Former   Smokeless tobacco: Never  Vaping Use   Vaping Use: Never used  Substance Use Topics   Alcohol use: Yes    Alcohol/week: 4.0 standard drinks    Types: 1 Cans of beer, 1 Shots of liquor, 2 Standard drinks or equivalent per week   Drug use: No  OPHTHALMIC EXAM:  Base Eye Exam     Visual Acuity (ETDRS)       Right Left   Dist [place] 20/40 +1 20/40 +2   Dist ph [place] NI 20/30  Pt states "they keep moving" on the left eye        Tonometry (Tonopen, 2:16 PM)       Right Left   Pressure 11 11         Pupils       Pupils Dark Light Shape React APD   Right PERRL 4 3 Round Brisk None   Left PERRL 4 3 Round Brisk None         Visual Fields (Counting fingers)       Left Right    Full Full         Extraocular Movement       Right Left    Full Full         Neuro/Psych     Oriented x3: Yes   Mood/Affect: Normal         Dilation     Both eyes: 1.0% Mydriacyl, 2.5% Phenylephrine @ 2:16 PM           Slit Lamp and Fundus Exam     External Exam       Right Left   External  Normal Normal         Slit Lamp Exam       Right Left   Lids/Lashes Normal Normal   Conjunctiva/Sclera White and quiet White and quiet   Cornea Clear Clear   Anterior Chamber Deep and quiet Deep and quiet   Iris Round and reactive Round and reactive   Lens Centered posterior chamber intraocular lens, multifocal Centered posterior chamber intraocular lens, multifocal   Anterior Vitreous Normal Normal         Fundus Exam       Right Left   Posterior Vitreous Clear, avitric    Disc Normal    C/D Ratio 0.15    Macula No topographic distortion, post ILM removal type whitening changes, Hard drusen, no membrane, no macular thickening    Vessels Normal    Periphery Normal             IMAGING AND PROCEDURES  Imaging and Procedures for 10/13/20  OCT, Retina - OU - Both Eyes       Right Eye Quality was good. Scan locations included subfoveal. Central Foveal Thickness: 338. Progression has no prior data. Findings include abnormal foveal contour, epiretinal membrane, retinal drusen , no IRF, no SRF.   Left Eye Quality was good. Scan locations included subfoveal. Central Foveal Thickness: 302. Progression has no prior data. Findings include retinal drusen , pigment epithelial detachment, no IRF, no SRF.   Notes Incidental posterior vitreous detachment.  OS  Post vitrectomy membrane peel OD, ILM now removed.  Less retinal thickening 8 week postop.  OS with drusenoid pigment epithelial detachments, no signs of active CNVM             ASSESSMENT/PLAN:  Macular pucker, right eye Looks great OD macular thickening continues to improve acuity improved now 8 weeks post vitrectomy membrane peel  Intermediate stage nonexudative age-related macular degeneration of both eyes No sign no signs of complication OU     0000000   1. Macular pucker, right eye  H35.371 OCT, Retina - OU - Both Eyes    2. 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  OD looks great with improving acuity  2.  Intermediate ARMD patient continues on AREDS 2 vitamin supplementation  3.  Patient confirmed that he has completed medications topically to the right eye and is no longer using the  Ophthalmic Meds Ordered this visit:  No orders of the defined types were placed in this encounter.      No follow-ups on file.  There are no Patient Instructions on file for this visit.   Explained the diagnoses, plan, and follow up with the patient and they expressed understanding.  Patient expressed understanding of the importance of proper follow up care.   Clent Demark Rana Adorno M.D. Diseases & Surgery of the Retina and Vitreous Retina & Diabetic [place] 10/13/20     Abbreviations: M myopia (nearsighted); A astigmatism; H hyperopia (farsighted); P presbyopia; Mrx spectacle prescription;  CTL contact lenses; OD right eye; OS left eye; OU both eyes  XT exotropia; ET esotropia; PEK punctate epithelial keratitis; PEE punctate epithelial erosions; DES dry eye syndrome; MGD meibomian gland dysfunction; ATs artificial tears; PFAT's preservative free artificial tears; [place] nuclear sclerotic cataract; PSC posterior subcapsular cataract; ERM epi-retinal membrane; PVD posterior vitreous detachment; RD retinal detachment; DM diabetes mellitus; DR diabetic retinopathy; NPDR non-proliferative diabetic retinopathy; PDR proliferative diabetic retinopathy; CSME clinically significant macular edema; DME diabetic macular edema; dbh dot blot hemorrhages; CWS cotton wool spot; POAG primary open angle glaucoma; C/D cup-to-disc ratio; HVF humphrey visual field; GVF goldmann visual field; OCT optical coherence tomography; IOP intraocular pressure; BRVO Branch retinal vein occlusion; CRVO central retinal vein occlusion; CRAO central retinal artery occlusion; BRAO branch retinal artery occlusion; RT retinal tear; SB scleral buckle; PPV pars plana vitrectomy; VH  Vitreous hemorrhage; PRP panretinal laser photocoagulation; IVK intravitreal kenalog; VMT vitreomacular traction; MH Macular hole;  NVD neovascularization of the disc; NVE neovascularization elsewhere; AREDS age related eye disease study; ARMD age related macular degeneration; POAG primary open angle glaucoma; EBMD epithelial/anterior basement membrane dystrophy; ACIOL anterior chamber intraocular lens; IOL intraocular lens; PCIOL posterior chamber intraocular lens; Phaco/IOL phacoemulsification with intraocular lens placement; [place] photorefractive keratectomy; LASIK laser assisted in situ keratomileusis; HTN hypertension; DM diabetes mellitus; COPD chronic obstructive pulmonary disease

## 2020-10-13 NOTE — Assessment & Plan Note (Signed)
Looks great OD macular thickening continues to improve acuity improved now 8 weeks post vitrectomy membrane peel

## 2020-10-14 ENCOUNTER — Encounter (INDEPENDENT_AMBULATORY_CARE_PROVIDER_SITE_OTHER): Payer: Medicare Other | Admitting: Ophthalmology

## 2020-11-24 DIAGNOSIS — Z23 Encounter for immunization: Secondary | ICD-10-CM | POA: Diagnosis not present

## 2020-12-18 DIAGNOSIS — Z23 Encounter for immunization: Secondary | ICD-10-CM | POA: Diagnosis not present

## 2021-01-31 NOTE — Progress Notes (Signed)
History of Present Illness: Here for f/u of BPH, elevated PSA   February, 1999- TRUS/Bx  --PSA 5.2. Path revealed  BPH and inflammatory change. Because of this PSA elevation while on finasteride, he underwent repeat TRUS/Bx on 7.8.2013. Prostatic volume was 104 mL--all 12 cores were benign, 2 revealed acute and chronic inflammation.   12.6.2022: IPSS 14  QoL score 2.  He has had no real urinary issues over the past year.  His wife is having a hard time as she has spinal stenosis.  She has limited mobility. Past Medical History:  Diagnosis Date   Cancer ([place]) 12/2018   skin cancer-melanoma   Varicose veins     Past Surgical History:  Procedure Laterality Date   CHOLECYSTECTOMY     COLONOSCOPY N/A 04/23/2019   Procedure: COLONOSCOPY;  Surgeon: Rogene Houston, MD;  [facility];  Service: Endoscopy;  Laterality: N/A;  930   ENDOVENOUS ABLATION SAPHENOUS VEIN W/ LASER     ENDOVENOUS ABLATION SAPHENOUS VEIN W/ LASER Left 12-21-2014   endovenous laser ablation left small saphenous vein  by Dr. Tinnie Gens    HEMORRHOID SURGERY     POLYPECTOMY  04/23/2019   Procedure: POLYPECTOMY;  Surgeon: Rogene Houston, MD;  [facility];  Service: Endoscopy;;    Home Medications:  Allergies as of 02/01/2021       Reactions   Bee Venom Anaphylaxis        Medication List        Accurate as of January 31, 2021  7:11 PM. If you have any questions, ask your nurse or doctor.          cholecalciferol 25 MCG (1000 UNIT) tablet Commonly known as: VITAMIN D3 Take 1,000 Units by mouth daily.   finasteride 5 MG tablet Commonly known as: PROSCAR Take 1 tablet (5 mg total) by mouth daily.   sildenafil 100 MG tablet Commonly known as: VIAGRA Take 1 tablet (100 mg total) by mouth daily as needed for erectile dysfunction.   sulfamethoxazole-trimethoprim 800-160 MG tablet Commonly known as: BACTRIM DS Take 1 tablet by mouth every 12 (twelve) hours.   tamsulosin 0.4 MG  Caps capsule Commonly known as: FLOMAX Take 1 capsule (0.4 mg total) by mouth daily.        Allergies:  Allergies  Allergen Reactions   Bee Venom Anaphylaxis    Family History  Problem Relation Age of Onset   Colon cancer Maternal Aunt     Social History:  reports that he has quit smoking. He has never used smokeless tobacco. He reports current alcohol use of about 4.0 standard drinks per week. He reports that he does not use drugs.  ROS: A complete review of systems was performed.  All systems are negative except for pertinent findings as noted.  Physical Exam:  Vital signs in last 24 hours: There were no vitals taken for this visit. Constitutional:  Alert and oriented, No acute distress Cardiovascular: Regular rate  Respiratory: Normal respiratory effort GI: Abdomen is soft, nontender, nondistended, no abdominal masses. No CVAT.  There are no inguinal hernias. Genitourinary: Normal male phallus, testes are descended bilaterally and non-tender and without masses, scrotum is normal in appearance without lesions or masses, perineum is normal on inspection.  Normal anal sphincter tone, prostate 80 mL in size Lymphatic: No lymphadenopathy Neurologic: Grossly intact, no focal deficits Psychiatric: Normal mood and affect  I have reviewed prior pt notes  I have reviewed urinalysis results  I have  reviewed prior PSA results   Impression/Assessment:  BPH, symptoms managed fairly well with dual medical therapy  History of elevated PSA, fairly normal DRE today  Plan:  PSA is checked today  Continue tamsulosin, finasteride  I will see back in 1 year

## 2021-02-01 ENCOUNTER — Encounter: Payer: Self-pay | Admitting: Urology

## 2021-02-01 ENCOUNTER — Other Ambulatory Visit: Payer: Self-pay

## 2021-02-01 ENCOUNTER — Ambulatory Visit (INDEPENDENT_AMBULATORY_CARE_PROVIDER_SITE_OTHER): Payer: Medicare Other | Admitting: Urology

## 2021-02-01 VITALS — BP 141/77 | HR 54

## 2021-02-01 DIAGNOSIS — R972 Elevated prostate specific antigen [PSA]: Secondary | ICD-10-CM

## 2021-02-01 DIAGNOSIS — N401 Enlarged prostate with lower urinary tract symptoms: Secondary | ICD-10-CM | POA: Diagnosis not present

## 2021-02-01 LAB — URINALYSIS, ROUTINE W REFLEX MICROSCOPIC
Bilirubin, UA: NEGATIVE
Glucose, UA: NEGATIVE
Nitrite, UA: NEGATIVE
Protein,UA: NEGATIVE
RBC, UA: NEGATIVE
Specific Gravity, UA: >1.03 — ABNORMAL HIGH (ref 1.005–1.030)
Urobilinogen, Ur: 1 mg/dL (ref 0.2–1.0)
pH, UA: 5.5 (ref 5.0–7.5)

## 2021-02-01 LAB — MICROSCOPIC EXAMINATION
Bacteria, UA: NONE SEEN
Epithelial Cells (non renal): NONE SEEN /hpf (ref 0–10)
RBC, Urine: NONE SEEN /hpf (ref 0–2)
Renal Epithel, UA: NONE SEEN /hpf

## 2021-02-01 NOTE — Progress Notes (Signed)
Urological Symptom Review  Patient is experiencing the following symptoms: Get up at night to urinate   Review of Systems  Gastrointestinal (upper)  : Negative for upper GI symptoms  Gastrointestinal (lower) : Negative for lower GI symptoms  Constitutional : Weight loss  Skin: Negative for skin symptoms  Eyes: Negative for eye symptoms  Ear/Nose/Throat : Negative for Ear/Nose/Throat symptoms  Hematologic/Lymphatic: Negative for Hematologic/Lymphatic symptoms  Cardiovascular : Negative for cardiovascular symptoms  Respiratory : Shortness of breath  Endocrine: Negative for endocrine symptoms  Musculoskeletal: Negative for musculoskeletal symptoms  Neurological: Negative for neurological symptoms  Psychologic: Negative for psychiatric symptoms

## 2021-02-02 LAB — PSA: Prostate Specific Ag, Serum: 1.6 ng/mL (ref 0.0–4.0)

## 2021-02-04 ENCOUNTER — Other Ambulatory Visit: Payer: Self-pay | Admitting: Urology

## 2021-02-04 DIAGNOSIS — N529 Male erectile dysfunction, unspecified: Secondary | ICD-10-CM

## 2021-03-09 DIAGNOSIS — H353131 Nonexudative age-related macular degeneration, bilateral, early dry stage: Secondary | ICD-10-CM | POA: Diagnosis not present

## 2021-03-09 DIAGNOSIS — Z961 Presence of intraocular lens: Secondary | ICD-10-CM | POA: Diagnosis not present

## 2021-03-09 DIAGNOSIS — H52203 Unspecified astigmatism, bilateral: Secondary | ICD-10-CM | POA: Diagnosis not present

## 2021-03-09 DIAGNOSIS — H524 Presbyopia: Secondary | ICD-10-CM | POA: Diagnosis not present

## 2021-03-27 DIAGNOSIS — M5136 Other intervertebral disc degeneration, lumbar region: Secondary | ICD-10-CM | POA: Diagnosis not present

## 2021-03-31 DIAGNOSIS — Z85820 Personal history of malignant melanoma of skin: Secondary | ICD-10-CM | POA: Diagnosis not present

## 2021-03-31 DIAGNOSIS — L570 Actinic keratosis: Secondary | ICD-10-CM | POA: Diagnosis not present

## 2021-03-31 DIAGNOSIS — H61001 Unspecified perichondritis of right external ear: Secondary | ICD-10-CM | POA: Diagnosis not present

## 2021-03-31 DIAGNOSIS — D225 Melanocytic nevi of trunk: Secondary | ICD-10-CM | POA: Diagnosis not present

## 2021-03-31 DIAGNOSIS — X32XXXD Exposure to sunlight, subsequent encounter: Secondary | ICD-10-CM | POA: Diagnosis not present

## 2021-03-31 DIAGNOSIS — Z08 Encounter for follow-up examination after completed treatment for malignant neoplasm: Secondary | ICD-10-CM | POA: Diagnosis not present

## 2021-04-18 ENCOUNTER — Ambulatory Visit (INDEPENDENT_AMBULATORY_CARE_PROVIDER_SITE_OTHER): Payer: Medicare Other | Admitting: Ophthalmology

## 2021-04-18 ENCOUNTER — Other Ambulatory Visit: Payer: Self-pay

## 2021-04-18 ENCOUNTER — Encounter (INDEPENDENT_AMBULATORY_CARE_PROVIDER_SITE_OTHER): Payer: Self-pay | Admitting: Ophthalmology

## 2021-04-18 DIAGNOSIS — H35371 Puckering of macula, right eye: Secondary | ICD-10-CM | POA: Diagnosis not present

## 2021-04-18 DIAGNOSIS — H353132 Nonexudative age-related macular degeneration, bilateral, intermediate dry stage: Secondary | ICD-10-CM | POA: Diagnosis not present

## 2021-04-18 NOTE — Progress Notes (Signed)
04/18/2021     CHIEF COMPLAINT Patient presents for  Chief Complaint  Patient presents with   Retina Follow Up      HISTORY OF PRESENT ILLNESS: Steve Hawkins is a 81 y.o. male who presents to the clinic today for:   HPI     Retina Follow Up           Diagnosis: Macular Pucker   Laterality: right eye   Onset: 6 months ago   Severity: mild   Course: stable         Comments   6 mos fu ou oct Patient states he went to Dr. Gershon Crane 1 month ago and has a new glasses prescription but has not filled it. Patient states he feels like reading is getting harder and seeing the road signs is harder.       Last edited by Laurin Coder on 04/18/2021  3:10 PM.      Referring physician: Rutherford Guys, [place],  [place] [address]  HISTORICAL INFORMATION:   Selected notes from the MEDICAL RECORD NUMBER       CURRENT MEDICATIONS: No current outpatient medications on file. (Ophthalmic Drugs)   No current facility-administered medications for this visit. (Ophthalmic Drugs)   Current Outpatient Medications (Other)  Medication Sig   cholecalciferol (VITAMIN D3) 25 MCG (1000 UNIT) tablet Take 1,000 Units by mouth daily.   finasteride (PROSCAR) 5 MG tablet Take 1 tablet (5 mg total) by mouth daily.   sildenafil (VIAGRA) 100 MG tablet TAKE ONE TABLET BY MOUTH DAILY AS NEEDED   sulfamethoxazole-trimethoprim (BACTRIM DS) 800-160 MG tablet Take 1 tablet by mouth every 12 (twelve) hours.   tamsulosin (FLOMAX) 0.4 MG CAPS capsule Take 1 capsule (0.4 mg total) by mouth daily.   No current facility-administered medications for this visit. (Other)      REVIEW OF SYSTEMS: ROS   Negative for: Constitutional, Gastrointestinal, Neurological, Skin, Genitourinary, Musculoskeletal, HENT, Endocrine, Cardiovascular, Eyes, Respiratory, Psychiatric, Allergic/Imm, Heme/Lymph Last edited by Hurman Horn, MD on 04/18/2021  3:31 PM.       ALLERGIES Allergies   Allergen Reactions   Bee Venom Anaphylaxis    PAST MEDICAL HISTORY Past Medical History:  Diagnosis Date   Cancer ([place]) 12/2018   skin cancer-melanoma   Varicose veins    Past Surgical History:  Procedure Laterality Date   CHOLECYSTECTOMY     COLONOSCOPY N/A 04/23/2019   Procedure: COLONOSCOPY;  Surgeon: Rogene Houston, MD;  [facility];  Service: Endoscopy;  Laterality: N/A;  930   ENDOVENOUS ABLATION SAPHENOUS VEIN W/ LASER     ENDOVENOUS ABLATION SAPHENOUS VEIN W/ LASER Left 12-21-2014   endovenous laser ablation left small saphenous vein  by Dr. Tinnie Gens    HEMORRHOID SURGERY     POLYPECTOMY  04/23/2019   Procedure: POLYPECTOMY;  Surgeon: Rogene Houston, MD;  [facility];  Service: Endoscopy;;    FAMILY HISTORY Family History  Problem Relation Age of Onset   Colon cancer Maternal Aunt     SOCIAL HISTORY Social History   Tobacco Use   Smoking status: Former   Smokeless tobacco: Never  Vaping Use   Vaping Use: Never used  Substance Use Topics   Alcohol use: Yes    Alcohol/week: 4.0 standard drinks    Types: 1 Cans of beer, 1 Shots of liquor, 2 Standard drinks or equivalent per week   Drug use: No  OPHTHALMIC EXAM:  Base Eye Exam     Visual Acuity (ETDRS)       Right Left   Dist cc 20/50 -1+2 20/30 -2   Dist ph cc 20/40 -2          Tonometry (Tonopen, 3:03 PM)       Right Left   Pressure 14 18         Pupils       Pupils Dark Light Shape React APD   Right PERRL 4 3 Round Brisk None   Left PERRL 4 3 Round Brisk None         Visual Fields (Counting fingers)       Left Right    Full Full         Extraocular Movement       Right Left    Full Full         Neuro/Psych     Oriented x3: Yes   Mood/Affect: Normal         Dilation     Both eyes: 1.0% Mydriacyl, 2.5% Phenylephrine @ 3:03 PM           Slit Lamp and Fundus Exam     External Exam       Right Left   External  Normal Normal         Slit Lamp Exam       Right Left   Lids/Lashes Normal Normal   Conjunctiva/Sclera White and quiet White and quiet   Cornea Clear Clear   Anterior Chamber Deep and quiet Deep and quiet   Iris Round and reactive Round and reactive   Lens Centered posterior chamber intraocular lens, multifocal Centered posterior chamber intraocular lens, multifocal   Anterior Vitreous Normal Normal         Fundus Exam       Right Left   Posterior Vitreous Clear, avitric Posterior vitreous detachment   Disc Normal Normal   C/D Ratio 0.15 0.2   Macula No topographic distortion, post ILM removal type whitening changes, Hard drusen, no membrane, no macular thickening Soft drusen, no hemorrhage, no macular thickening   Vessels Normal Normal   Periphery Normal Normal            IMAGING AND PROCEDURES  Imaging and Procedures for 04/18/21  OCT, Retina - OU - Both Eyes       Right Eye Quality was good. Scan locations included subfoveal. Central Foveal Thickness: 348. Progression has no prior data. Findings include abnormal foveal contour, epiretinal membrane, retinal drusen , no IRF, no SRF.   Left Eye Quality was good. Scan locations included subfoveal. Central Foveal Thickness: 346. Progression has no prior data. Findings include retinal drusen , pigment epithelial detachment, no IRF, no SRF.   Notes Incidental posterior vitreous detachment.  OS  Post vitrectomy membrane peel OD, ILM now removed.  Slight increase in thickness as compared to 6 months previous, not associated with ERM and not associated with AMD.  OS with drusenoid pigment epithelial detachments, no signs of active CNVM             ASSESSMENT/PLAN:  Macular pucker, right eye Now some 8 months post vitrectomy membrane peel and after initial improvement in the only acuity but also anatomy, there is now a 10 m thickening over the ensuing 6 months post surgery.  Hyperintense intraretinal  findings appear to be on either side of the fovea but these are clearly not internal limiting membrane remnants,  as prior OCT performed June and August 2022 show that these had been removed.    Intermediate stage nonexudative age-related macular degeneration of both eyes No interval change left eye or right eye, no obvious CNVM in either eye  Minor thickening has occurred in the right eye at 24 m which is not relatable to the ARMD.     ICD-10-CM   1. Macular pucker, right eye  H35.371 OCT, Retina - OU - Both Eyes    2. Intermediate stage nonexudative age-related macular degeneration of both eyes  H35.3132       1.  OD, anatomically looks great, there is no sign of epiretinal membrane remnant but intermediate ARMD does persist but no sign of CNVM  2.  Anatomically OD with 10 m thickening as compared to last visit this does raise the specter of causality, and for this reason I did review of systems for sleep apnea which is borderline.  He is ask to have his wife observe him at night for gasping for breath, holding his breath, not breathing, or sounds of snoring which could all or each be associated with sleep apnea in conjunction with his twice nightly nocturia as well as napping in the afternoon  3.  Follow-up next in 2 months simply to monitor this condition to confirm no other progression may consider FFA for microvascular leakage if symptoms develop or if findings worsen  Ophthalmic Meds Ordered this visit:  No orders of the defined types were placed in this encounter.      Return in about 8 weeks (around 06/13/2021) for DILATE OU, OCT.  Patient Instructions  Patient to ask for assistance in determining whether he holds his breath, stops breathing or snores at night which in conjunction with taking daily naps and nocturia twice nightly could could suggest mild sleep apnea.  I explained the patient the sleep apnea exist, that can to be a stress nightly to the brain but also to this  portion of the visual acuity that is the center of the vision   Explained the diagnoses, plan, and follow up with the patient and they expressed understanding.  Patient expressed understanding of the importance of proper follow up care.   Clent Demark Buzz Axel M.D. Diseases & Surgery of the Retina and Vitreous Retina & Diabetic [place] 04/18/21     Abbreviations: M myopia (nearsighted); A astigmatism; H hyperopia (farsighted); P presbyopia; Mrx spectacle prescription;  CTL contact lenses; OD right eye; OS left eye; OU both eyes  XT exotropia; ET esotropia; PEK punctate epithelial keratitis; PEE punctate epithelial erosions; DES dry eye syndrome; MGD meibomian gland dysfunction; ATs artificial tears; PFAT's preservative free artificial tears; [place] nuclear sclerotic cataract; PSC posterior subcapsular cataract; ERM epi-retinal membrane; PVD posterior vitreous detachment; RD retinal detachment; DM diabetes mellitus; DR diabetic retinopathy; NPDR non-proliferative diabetic retinopathy; PDR proliferative diabetic retinopathy; CSME clinically significant macular edema; DME diabetic macular edema; dbh dot blot hemorrhages; CWS cotton wool spot; POAG primary open angle glaucoma; C/D cup-to-disc ratio; HVF humphrey visual field; GVF goldmann visual field; OCT optical coherence tomography; IOP intraocular pressure; BRVO Branch retinal vein occlusion; CRVO central retinal vein occlusion; CRAO central retinal artery occlusion; BRAO branch retinal artery occlusion; RT retinal tear; SB scleral buckle; PPV pars plana vitrectomy; VH Vitreous hemorrhage; PRP panretinal laser photocoagulation; IVK intravitreal kenalog; VMT vitreomacular traction; MH Macular hole;  NVD neovascularization of the disc; NVE neovascularization elsewhere; AREDS age related eye disease study; ARMD age related macular degeneration; POAG primary open angle  glaucoma; EBMD epithelial/anterior basement membrane dystrophy; ACIOL anterior chamber  intraocular lens; IOL intraocular lens; PCIOL posterior chamber intraocular lens; Phaco/IOL phacoemulsification with intraocular lens placement; [place] photorefractive keratectomy; LASIK laser assisted in situ keratomileusis; HTN hypertension; DM diabetes mellitus; COPD chronic obstructive pulmonary disease

## 2021-04-18 NOTE — Assessment & Plan Note (Signed)
Now some 8 months post vitrectomy membrane peel and after initial improvement in the only acuity but also anatomy, there is now a 10 m thickening over the ensuing 6 months post surgery.  Hyperintense intraretinal findings appear to be on either side of the fovea but these are clearly not internal limiting membrane remnants, as prior OCT performed June and August 2022 show that these had been removed.

## 2021-04-18 NOTE — Assessment & Plan Note (Signed)
No interval change left eye or right eye, no obvious CNVM in either eye  Minor thickening has occurred in the right eye at 73 m which is not relatable to the ARMD.

## 2021-04-18 NOTE — Patient Instructions (Signed)
Patient to ask for assistance in determining whether he holds his breath, stops breathing or snores at night which in conjunction with taking daily naps and nocturia twice nightly could could suggest mild sleep apnea.  I explained the patient the sleep apnea exist, that can to be a stress nightly to the brain but also to this portion of the visual acuity that is the center of the vision

## 2021-04-19 ENCOUNTER — Other Ambulatory Visit: Payer: Self-pay | Admitting: Urology

## 2021-04-19 DIAGNOSIS — N401 Enlarged prostate with lower urinary tract symptoms: Secondary | ICD-10-CM

## 2021-05-13 ENCOUNTER — Other Ambulatory Visit: Payer: Self-pay | Admitting: Urology

## 2021-05-13 DIAGNOSIS — N401 Enlarged prostate with lower urinary tract symptoms: Secondary | ICD-10-CM

## 2021-06-13 ENCOUNTER — Encounter (INDEPENDENT_AMBULATORY_CARE_PROVIDER_SITE_OTHER): Payer: Medicare Other | Admitting: Ophthalmology

## 2021-06-14 ENCOUNTER — Encounter (INDEPENDENT_AMBULATORY_CARE_PROVIDER_SITE_OTHER): Payer: Medicare Other | Admitting: Ophthalmology

## 2021-08-12 DIAGNOSIS — N401 Enlarged prostate with lower urinary tract symptoms: Secondary | ICD-10-CM | POA: Diagnosis not present

## 2021-08-12 DIAGNOSIS — M5136 Other intervertebral disc degeneration, lumbar region: Secondary | ICD-10-CM | POA: Diagnosis not present

## 2021-08-12 DIAGNOSIS — Z85820 Personal history of malignant melanoma of skin: Secondary | ICD-10-CM | POA: Diagnosis not present

## 2021-08-12 DIAGNOSIS — N529 Male erectile dysfunction, unspecified: Secondary | ICD-10-CM | POA: Diagnosis not present

## 2021-08-12 DIAGNOSIS — R001 Bradycardia, unspecified: Secondary | ICD-10-CM | POA: Diagnosis not present

## 2021-08-19 DIAGNOSIS — R001 Bradycardia, unspecified: Secondary | ICD-10-CM | POA: Diagnosis not present

## 2021-08-19 DIAGNOSIS — Z8520 Personal history of malignant neoplasm of unspecified respiratory organ: Secondary | ICD-10-CM | POA: Diagnosis not present

## 2021-08-19 DIAGNOSIS — N400 Enlarged prostate without lower urinary tract symptoms: Secondary | ICD-10-CM | POA: Diagnosis not present

## 2021-08-19 DIAGNOSIS — Z6823 Body mass index (BMI) 23.0-23.9, adult: Secondary | ICD-10-CM | POA: Diagnosis not present

## 2021-08-24 DIAGNOSIS — K4090 Unilateral inguinal hernia, without obstruction or gangrene, not specified as recurrent: Secondary | ICD-10-CM | POA: Diagnosis not present

## 2021-08-29 DIAGNOSIS — G4733 Obstructive sleep apnea (adult) (pediatric): Secondary | ICD-10-CM | POA: Diagnosis not present

## 2021-09-06 ENCOUNTER — Ambulatory Visit (INDEPENDENT_AMBULATORY_CARE_PROVIDER_SITE_OTHER): Payer: Medicare Other | Admitting: Ophthalmology

## 2021-09-06 ENCOUNTER — Encounter (INDEPENDENT_AMBULATORY_CARE_PROVIDER_SITE_OTHER): Payer: Self-pay | Admitting: Ophthalmology

## 2021-09-06 DIAGNOSIS — H35371 Puckering of macula, right eye: Secondary | ICD-10-CM | POA: Diagnosis not present

## 2021-09-06 DIAGNOSIS — H353132 Nonexudative age-related macular degeneration, bilateral, intermediate dry stage: Secondary | ICD-10-CM

## 2021-09-06 DIAGNOSIS — H43812 Vitreous degeneration, left eye: Secondary | ICD-10-CM

## 2021-09-06 NOTE — Assessment & Plan Note (Signed)
Physiologic OS 

## 2021-09-06 NOTE — Assessment & Plan Note (Signed)
Intermediate AMD.  Patient continues on oral vitamin therapy

## 2021-09-06 NOTE — Assessment & Plan Note (Signed)
OD now 1 year post vitrectomy membrane peel for severe epiretinal membrane no progression no change

## 2021-09-06 NOTE — Progress Notes (Signed)
09/06/2021     CHIEF COMPLAINT Patient presents for  Chief Complaint  Patient presents with   Retina Follow Up      HISTORY OF PRESENT ILLNESS: Steve Hawkins is a 81 y.o. male who presents to the clinic today for:   HPI     Retina Follow Up           Diagnosis: Other   Laterality: right eye   Severity: moderate   Course: stable         Comments   4 MOS for DILATE OU, OCT. Pt stated no changes in vision since last Pt stated he had the sleep apnea test but has not received the results yet.       Last edited by Silvestre Moment on 09/06/2021 10:33 AM.      Referring physician: Asencion Noble, MD [address] [place],  [place] [address]  HISTORICAL INFORMATION:   Selected notes from the MEDICAL RECORD NUMBER       CURRENT MEDICATIONS: No current outpatient medications on file. (Ophthalmic Drugs)   No current facility-administered medications for this visit. (Ophthalmic Drugs)   Current Outpatient Medications (Other)  Medication Sig   cholecalciferol (VITAMIN D3) 25 MCG (1000 UNIT) tablet Take 1,000 Units by mouth daily.   finasteride (PROSCAR) 5 MG tablet TAKE 1 TABLET (5 MG TOTAL) BY MOUTH DAILY.   sildenafil (VIAGRA) 100 MG tablet TAKE ONE TABLET BY MOUTH DAILY AS NEEDED   sulfamethoxazole-trimethoprim (BACTRIM DS) 800-160 MG tablet Take 1 tablet by mouth every 12 (twelve) hours.   tamsulosin (FLOMAX) 0.4 MG CAPS capsule TAKE 1 CAPSULE BY MOUTH EVERY DAY   No current facility-administered medications for this visit. (Other)      REVIEW OF SYSTEMS: ROS   Negative for: Constitutional, Gastrointestinal, Neurological, Skin, Genitourinary, Musculoskeletal, HENT, Endocrine, Cardiovascular, Eyes, Respiratory, Psychiatric, Allergic/Imm, Heme/Lymph Last edited by Silvestre Moment on 09/06/2021 10:33 AM.       ALLERGIES Allergies  Allergen Reactions   Bee Venom Anaphylaxis    PAST MEDICAL HISTORY Past Medical History:  Diagnosis Date   Cancer ([place]) 12/2018    skin cancer-melanoma   Varicose veins    Past Surgical History:  Procedure Laterality Date   CHOLECYSTECTOMY     COLONOSCOPY N/A 04/23/2019   Procedure: COLONOSCOPY;  Surgeon: Rogene Houston, MD;  [facility];  Service: Endoscopy;  Laterality: N/A;  930   ENDOVENOUS ABLATION SAPHENOUS VEIN W/ LASER     ENDOVENOUS ABLATION SAPHENOUS VEIN W/ LASER Left 12-21-2014   endovenous laser ablation left small saphenous vein  by Dr. Tinnie Gens    HEMORRHOID SURGERY     POLYPECTOMY  04/23/2019   Procedure: POLYPECTOMY;  Surgeon: Rogene Houston, MD;  Location: [facility];  Service: Endoscopy;;    FAMILY HISTORY Family History  Problem Relation Age of Onset   Colon cancer Maternal Aunt     SOCIAL HISTORY Social History   Tobacco Use   Smoking status: Former   Smokeless tobacco: Never  Vaping Use   Vaping Use: Never used  Substance Use Topics   Alcohol use: Yes    Alcohol/week: 4.0 standard drinks of alcohol    Types: 1 Cans of beer, 1 Shots of liquor, 2 Standard drinks or equivalent per week   Drug use: No         OPHTHALMIC EXAM:  Base Eye Exam     Visual Acuity (ETDRS)       Right Left  Dist [place] 20/60 -2 20/40   Dist ph [place] 20/40 -1 NI         Tonometry (Tonopen, 10:38 AM)       Right Left   Pressure 20 23         Pupils       Pupils APD   Right PERRL None   Left PERRL None         Visual Fields       Left Right    Full Full         Extraocular Movement       Right Left    Full Full         Neuro/Psych     Oriented x3: Yes   Mood/Affect: Normal         Dilation     Both eyes: 1.0% Mydriacyl, 2.5% Phenylephrine @ 10:38 AM           Slit Lamp and Fundus Exam     External Exam       Right Left   External Normal Normal         Slit Lamp Exam       Right Left   Lids/Lashes Normal Normal   Conjunctiva/Sclera White and quiet White and quiet   Cornea Clear Clear   Anterior Chamber Deep and quiet  Deep and quiet   Iris Round and reactive Round and reactive   Lens Centered posterior chamber intraocular lens, multifocal Centered posterior chamber intraocular lens, multifocal   Anterior Vitreous Normal Normal         Fundus Exam       Right Left   Posterior Vitreous Clear, avitric Posterior vitreous detachment   Disc Normal Normal   C/D Ratio 0.15 0.2   Macula No topographic distortion, post ILM removal type whitening changes, Hard drusen, no membrane, no macular thickening Soft drusen, no hemorrhage, no macular thickening   Vessels Normal Normal   Periphery Normal Normal            IMAGING AND PROCEDURES  Imaging and Procedures for 09/06/21  OCT, Retina - OU - Both Eyes       Right Eye Quality was good. Scan locations included subfoveal. Central Foveal Thickness: 354. Progression has worsened. Findings include no IRF, no SRF, abnormal foveal contour, retinal drusen , epiretinal membrane.   Left Eye Quality was good. Scan locations included subfoveal. Central Foveal Thickness: 318. Progression has improved. Findings include no IRF, no SRF, retinal drusen , pigment epithelial detachment.   Notes Incidental posterior vitreous detachment.  OS  Post vitrectomy membrane peel OD, ILM now removed.  Slight increase in thickness as compared to 6 months previous, not associated with ERM and not associated with AMD.  OS with drusenoid pigment epithelial detachments, no signs of active CNVM              ASSESSMENT/PLAN:  Intermediate stage nonexudative age-related macular degeneration of both eyes Intermediate AMD.  Patient continues on oral vitamin therapy  Macular pucker, right eye OD now 1 year post vitrectomy membrane peel for severe epiretinal membrane no progression no change  Posterior vitreous detachment of left eye Physiologic OS     ICD-10-CM   1. Macular pucker, right eye  H35.371 OCT, Retina - OU - Both Eyes    CANCELED: Color Fundus Photography  Optos - OU - Both Eyes    2. Intermediate stage nonexudative age-related macular degeneration of both eyes  H35.3132  3. Posterior vitreous detachment of left eye  H43.812       1.  Intermediate ARMD OU.  No sign of wet AMD today.  Patient continues on oral vitamin therapy  2.  OD post vitrectomy membrane peel overall improved nicely.  3.  Patient awaiting sleep study results.  If sleep apnea is present I recommend treatment of this condition to prevent nightly hypoxic progression of CNS disorders, cardiac disorders but also potentially ocular and ophthalmic and macular disorders  Ophthalmic Meds Ordered this visit:  No orders of the defined types were placed in this encounter.      Return in about 6 months (around 03/09/2022) for DILATE OU, OCT, COLOR FP.  There are no Patient Instructions on file for this visit.   Explained the diagnoses, plan, and follow up with the patient and they expressed understanding.  Patient expressed understanding of the importance of proper follow up care.   Clent Demark Alexza Norbeck M.D. Diseases & Surgery of the Retina and Vitreous Retina & Diabetic [place] 09/06/21     Abbreviations: M myopia (nearsighted); A astigmatism; H hyperopia (farsighted); P presbyopia; Mrx spectacle prescription;  CTL contact lenses; OD right eye; OS left eye; OU both eyes  XT exotropia; ET esotropia; PEK punctate epithelial keratitis; PEE punctate epithelial erosions; DES dry eye syndrome; MGD meibomian gland dysfunction; ATs artificial tears; PFAT's preservative free artificial tears; [place] nuclear sclerotic cataract; PSC posterior subcapsular cataract; ERM epi-retinal membrane; PVD posterior vitreous detachment; RD retinal detachment; DM diabetes mellitus; DR diabetic retinopathy; NPDR non-proliferative diabetic retinopathy; PDR proliferative diabetic retinopathy; CSME clinically significant macular edema; DME diabetic macular edema; dbh dot blot hemorrhages; CWS cotton wool  spot; POAG primary open angle glaucoma; C/D cup-to-disc ratio; HVF humphrey visual field; GVF goldmann visual field; OCT optical coherence tomography; IOP intraocular pressure; BRVO Branch retinal vein occlusion; CRVO central retinal vein occlusion; CRAO central retinal artery occlusion; BRAO branch retinal artery occlusion; RT retinal tear; SB scleral buckle; PPV pars plana vitrectomy; VH Vitreous hemorrhage; PRP panretinal laser photocoagulation; IVK intravitreal kenalog; VMT vitreomacular traction; MH Macular hole;  NVD neovascularization of the disc; NVE neovascularization elsewhere; AREDS age related eye disease study; ARMD age related macular degeneration; POAG primary open angle glaucoma; EBMD epithelial/anterior basement membrane dystrophy; ACIOL anterior chamber intraocular lens; IOL intraocular lens; PCIOL posterior chamber intraocular lens; Phaco/IOL phacoemulsification with intraocular lens placement; [place] photorefractive keratectomy; LASIK laser assisted in situ keratomileusis; HTN hypertension; DM diabetes mellitus; COPD chronic obstructive pulmonary disease

## 2021-09-09 DIAGNOSIS — K4090 Unilateral inguinal hernia, without obstruction or gangrene, not specified as recurrent: Secondary | ICD-10-CM | POA: Diagnosis not present

## 2021-09-13 ENCOUNTER — Telehealth: Payer: Self-pay

## 2021-09-13 NOTE — Telephone Encounter (Signed)
Error- epic message sent to provider regarding appt time/date for consult

## 2021-10-06 DIAGNOSIS — D485 Neoplasm of uncertain behavior of skin: Secondary | ICD-10-CM | POA: Diagnosis not present

## 2021-10-06 DIAGNOSIS — D2261 Melanocytic nevi of right upper limb, including shoulder: Secondary | ICD-10-CM | POA: Diagnosis not present

## 2021-10-06 DIAGNOSIS — Z08 Encounter for follow-up examination after completed treatment for malignant neoplasm: Secondary | ICD-10-CM | POA: Diagnosis not present

## 2021-10-06 DIAGNOSIS — D225 Melanocytic nevi of trunk: Secondary | ICD-10-CM | POA: Diagnosis not present

## 2021-10-06 DIAGNOSIS — Z85820 Personal history of malignant melanoma of skin: Secondary | ICD-10-CM | POA: Diagnosis not present

## 2021-10-06 DIAGNOSIS — Z1283 Encounter for screening for malignant neoplasm of skin: Secondary | ICD-10-CM | POA: Diagnosis not present

## 2021-10-28 DIAGNOSIS — K4090 Unilateral inguinal hernia, without obstruction or gangrene, not specified as recurrent: Secondary | ICD-10-CM | POA: Diagnosis not present

## 2021-10-29 ENCOUNTER — Other Ambulatory Visit: Payer: Self-pay

## 2021-10-29 ENCOUNTER — Encounter (HOSPITAL_COMMUNITY): Payer: Self-pay | Admitting: Emergency Medicine

## 2021-10-29 ENCOUNTER — Emergency Department (HOSPITAL_COMMUNITY)
Admission: EM | Admit: 2021-10-29 | Discharge: 2021-10-29 | Disposition: A | Discharge status: Home / Self Care | Payer: Medicare Other | Attending: Emergency Medicine | Admitting: Emergency Medicine

## 2021-10-29 DIAGNOSIS — R339 Retention of urine, unspecified: Secondary | ICD-10-CM | POA: Diagnosis not present

## 2021-10-29 LAB — URINALYSIS, ROUTINE W REFLEX MICROSCOPIC
Bacteria, UA: NONE SEEN
Bilirubin Urine: NEGATIVE
Glucose, UA: NEGATIVE mg/dL
Ketones, ur: NEGATIVE mg/dL
Leukocytes,Ua: NEGATIVE
Nitrite: NEGATIVE
Protein, ur: 30 mg/dL — AB
Specific Gravity, Urine: 1.003 — ABNORMAL LOW (ref 1.005–1.030)
pH: 7 (ref 5.0–8.0)

## 2021-10-29 NOTE — ED Triage Notes (Signed)
Pt s/p hernia repair yesterday. States he has not been able to void since 2300 last night.

## 2021-10-29 NOTE — ED Provider Notes (Signed)
[facility] EMERGENCY DEPARTMENT  Provider Note  CSN: [id number] Arrival date & time: 10/29/21 0251  History Chief Complaint  Patient presents with   Urinary Retention    Steve Hawkins is a 81 y.o. male is less than 24 hours post-op from an outpatient laparoscopic inguinal hernia repair. He was able to urinate for most of the rest of the day but woke up around 1am with urinary retention. Had a large volume of urine on bladder scan and foley was placed prior to my evaluation with resolution. He had similar in the remote past and is followed by Urology.    Home Medications Prior to Admission medications   Medication Sig Start Date End Date Taking? Authorizing Provider  cholecalciferol (VITAMIN D3) 25 MCG (1000 UNIT) tablet Take 1,000 Units by mouth daily.    [provider]  finasteride (PROSCAR) 5 MG tablet TAKE 1 TABLET (5 MG TOTAL) BY MOUTH DAILY. 05/16/21   Franchot Gallo, MD  sildenafil (VIAGRA) 100 MG tablet TAKE ONE TABLET BY MOUTH DAILY AS NEEDED 02/08/21   Franchot Gallo, MD  sulfamethoxazole-trimethoprim (BACTRIM DS) 800-160 MG tablet Take 1 tablet by mouth every 12 (twelve) hours. 04/08/20   Irine Seal, MD  tamsulosin (FLOMAX) 0.4 MG CAPS capsule TAKE 1 CAPSULE BY MOUTH EVERY DAY 04/26/21   Franchot Gallo, MD     Allergies    Bee venom   Review of Systems   Review of Systems Please see HPI for pertinent positives and negatives  Physical Exam BP (!) 163/80 (BP Location: Left Arm)   Pulse 71   Temp 97.9 F (36.6 C) (Oral)   Resp 20   Ht '5\' 8"'$  (1.727 m)   Wt 66.7 kg   SpO2 100%   BMI 22.35 kg/m   Physical Exam Vitals and nursing note reviewed.  Constitutional:      Appearance: Normal appearance.  HENT:     Head: Normocephalic and atraumatic.     Nose: Nose normal.     Mouth/Throat:     Mouth: Mucous membranes are moist.  Eyes:     Extraocular Movements: Extraocular movements intact.     Conjunctiva/sclera: Conjunctivae normal.   Cardiovascular:     Rate and Rhythm: Normal rate.  Pulmonary:     Effort: Pulmonary effort is normal.     Breath sounds: Normal breath sounds.  Abdominal:     General: Abdomen is flat.     Palpations: Abdomen is soft.     Tenderness: There is no abdominal tenderness.     Comments: Surgical incisions are clean and dry.   Musculoskeletal:        General: No swelling. Normal range of motion.     Cervical back: Neck supple.  Skin:    General: Skin is warm and dry.  Neurological:     General: No focal deficit present.     Mental Status: He is alert.  Psychiatric:        Mood and Affect: Mood normal.     ED Results / Procedures / Treatments   EKG None  Procedures Procedures  Medications Ordered in the ED Medications - No data to display  Initial Impression and Plan  Patient with urinary retention post-op. Symptoms resolved with foley placement. UA is neg for infection. Plan discharge with leg bag and outpatient urology follow up.   ED Course       MDM Rules/Calculators/A&P Medical Decision Making Problems Addressed: Urinary retention: acute illness or injury  Amount and/or Complexity  of Data Reviewed Labs: ordered. Decision-making details documented in ED Course.    Final Clinical Impression(s) / ED Diagnoses Final diagnoses:  Urinary retention    Rx / DC Orders ED Discharge Orders     None        Truddie Hidden, MD 10/29/21 [phone]

## 2021-10-29 NOTE — ED Notes (Signed)
  Foley bag converted to leg bag.  Patient instructed on how to secure and empty leg bag.

## 2021-10-31 ENCOUNTER — Other Ambulatory Visit: Payer: Self-pay

## 2021-10-31 ENCOUNTER — Emergency Department (HOSPITAL_COMMUNITY)
Admission: EM | Admit: 2021-10-31 | Discharge: 2021-10-31 | Disposition: A | Discharge status: Home / Self Care | Payer: Medicare Other | Attending: Emergency Medicine | Admitting: Emergency Medicine

## 2021-10-31 ENCOUNTER — Encounter (HOSPITAL_COMMUNITY): Payer: Self-pay

## 2021-10-31 DIAGNOSIS — R339 Retention of urine, unspecified: Secondary | ICD-10-CM | POA: Diagnosis not present

## 2021-10-31 NOTE — ED Triage Notes (Signed)
Pt states he had a foley placed 9/2 due to urinary retention after a hernia repair done on 9/2. States he was told to have catheter removed after 2 days but being it's a holiday he came here.

## 2021-10-31 NOTE — ED Provider Notes (Signed)
[facility] EMERGENCY DEPARTMENT Provider Note   CSN: [id number] Arrival date & time: 10/31/21  4098     History  Chief Complaint  Patient presents with   Catheter Removal    Steve Hawkins is a 81 y.o. male.  Patient seen September 2 for urinary retention proximately 24 hours out from inguinal hernia repair laparoscopically.  Foley catheter placed patient had distention of the bladder at that time based on bladder scan.  Patient is now here wanting the Foley catheter out.  Has seen urology in the past.  Urine in the bag is clear.  No blood.  No fevers.  Patient states his wounds are healing well from the hernia repair.       Home Medications Prior to Admission medications   Medication Sig Start Date End Date Taking? Authorizing Provider  cholecalciferol (VITAMIN D3) 25 MCG (1000 UNIT) tablet Take 1,000 Units by mouth daily.    [provider]  finasteride (PROSCAR) 5 MG tablet TAKE 1 TABLET (5 MG TOTAL) BY MOUTH DAILY. 05/16/21   Franchot Gallo, MD  sildenafil (VIAGRA) 100 MG tablet TAKE ONE TABLET BY MOUTH DAILY AS NEEDED 02/08/21   Franchot Gallo, MD  sulfamethoxazole-trimethoprim (BACTRIM DS) 800-160 MG tablet Take 1 tablet by mouth every 12 (twelve) hours. 04/08/20   Irine Seal, MD  tamsulosin (FLOMAX) 0.4 MG CAPS capsule TAKE 1 CAPSULE BY MOUTH EVERY DAY 04/26/21   Franchot Gallo, MD      Allergies    Bee venom    Review of Systems   Review of Systems  Constitutional:  Negative for chills and fever.  HENT:  Negative for ear pain and sore throat.   Eyes:  Negative for pain and visual disturbance.  Respiratory:  Negative for cough and shortness of breath.   Cardiovascular:  Negative for chest pain and palpitations.  Gastrointestinal:  Negative for abdominal pain and vomiting.  Genitourinary:  Positive for difficulty urinating. Negative for dysuria and hematuria.  Musculoskeletal:  Negative for arthralgias and back pain.  Skin:  Negative for color  change and rash.  Neurological:  Negative for seizures and syncope.  All other systems reviewed and are negative.   Physical Exam Updated Vital Signs BP 128/73   Pulse 64   Temp (!) 97.4 F (36.3 C)   Resp 18   Ht 1.727 m ('5\' 8"'$ )   Wt 66 kg   SpO2 98%   BMI 22.12 kg/m  Physical Exam Vitals and nursing note reviewed.  Constitutional:      General: He is not in acute distress.    Appearance: Normal appearance. He is well-developed. He is not ill-appearing.  HENT:     Head: Normocephalic and atraumatic.  Eyes:     Conjunctiva/sclera: Conjunctivae normal.  Cardiovascular:     Rate and Rhythm: Normal rate and regular rhythm.     Heart sounds: No murmur heard. Pulmonary:     Effort: Pulmonary effort is normal. No respiratory distress.     Breath sounds: Normal breath sounds.  Abdominal:     Palpations: Abdomen is soft.     Tenderness: There is no abdominal tenderness.     Comments: Laparoscopic wounds healing well.  Musculoskeletal:        General: No swelling.     Cervical back: Neck supple.  Skin:    General: Skin is warm and dry.     Capillary Refill: Capillary refill takes less than 2 seconds.  Neurological:     General: No focal  deficit present.     Mental Status: He is alert and oriented to person, place, and time.  Psychiatric:        Mood and Affect: Mood normal.     ED Results / Procedures / Treatments   Labs (all labs ordered are listed, but only abnormal results are displayed) Labs Reviewed - No data to display  EKG None  Radiology No results found.  Procedures Procedures    Medications Ordered in ED Medications - No data to display  ED Course/ Medical Decision Making/ A&P                           Medical Decision Making  Foley catheter in place urine in the bag is clear.  Patient's hernia repair wounds are healing well.  Patient wants the Foley catheter out does not want to wait to follow-up with urology.  So we will go ahead and  remove it.  Patient will still follow-up with urology.  He will return for any problems with retention.  It has been 48 hours if Szymon in place so could be a little premature but has a chance of being able to void on his own post removal. Final Clinical Impression(s) / ED Diagnoses Final diagnoses:  Urinary retention    Rx / DC Orders ED Discharge Orders     None         Fredia Sorrow, MD 10/31/21 1051

## 2021-10-31 NOTE — Discharge Instructions (Addendum)
Return for any recurrent retention or difficulty urinating.  Follow-up with the urologist no matter what.

## 2021-11-01 ENCOUNTER — Encounter: Payer: Self-pay | Admitting: Urology

## 2021-11-01 ENCOUNTER — Ambulatory Visit (INDEPENDENT_AMBULATORY_CARE_PROVIDER_SITE_OTHER): Payer: Medicare Other | Admitting: Urology

## 2021-11-01 VITALS — BP 121/74 | HR 82

## 2021-11-01 DIAGNOSIS — R339 Retention of urine, unspecified: Secondary | ICD-10-CM | POA: Diagnosis not present

## 2021-11-01 DIAGNOSIS — N401 Enlarged prostate with lower urinary tract symptoms: Secondary | ICD-10-CM

## 2021-11-01 DIAGNOSIS — N5201 Erectile dysfunction due to arterial insufficiency: Secondary | ICD-10-CM | POA: Diagnosis not present

## 2021-11-01 DIAGNOSIS — R972 Elevated prostate specific antigen [PSA]: Secondary | ICD-10-CM

## 2021-11-01 LAB — URINALYSIS, ROUTINE W REFLEX MICROSCOPIC
Bilirubin, UA: NEGATIVE
Glucose, UA: NEGATIVE
Ketones, UA: NEGATIVE
Nitrite, UA: NEGATIVE
Protein,UA: NEGATIVE
Specific Gravity, UA: 1.01 (ref 1.005–1.030)
Urobilinogen, Ur: 0.2 mg/dL (ref 0.2–1.0)
pH, UA: 5 (ref 5.0–7.5)

## 2021-11-01 LAB — MICROSCOPIC EXAMINATION
Epithelial Cells (non renal): NONE SEEN /hpf (ref 0–10)
Renal Epithel, UA: NONE SEEN /hpf

## 2021-11-01 LAB — BLADDER SCAN AMB NON-IMAGING

## 2021-11-01 NOTE — Progress Notes (Signed)
post void residual=19 

## 2021-11-01 NOTE — Progress Notes (Signed)
History of Present Illness:   Here for f/u of BPH, elevated PSA     February, 1999- TRUS/Bx  --PSA 5.2. Path revealed  BPH and inflammatory change. Because of this PSA elevation while on finasteride, he underwent repeat TRUS/Bx on 7.8.2013. Prostatic volume was 104 mL--all 12 cores were benign, 2 revealed acute and chronic inflammation.    12.6.2022: IPSS 14  QoL score 2.  He has had no real urinary issues over the past year.  His wife is having a hard time as she has spinal stenosis.  She has limited mobility.  9.5.2023: Here for follow-up.  Urinary retention.  Had laparoscopic inguinal hernia repair on September 1.  He presented to the emergency room yesterday wanting to have the catheter removed.  That was done.  He comes in today for recheck.  He has been on finasteride and tamsulosin.  He feels like he is emptying well.  He discussed his wife's condition with me.  She has spinal stenosis and had surgery fairly recently.  However, hardware has shifted.  She has significant pain and immobility.  He has basically been her total caregiver.  Past Medical History:  Diagnosis Date   Cancer ([place]) 12/2018   skin cancer-melanoma   Varicose veins     Past Surgical History:  Procedure Laterality Date   CHOLECYSTECTOMY     COLONOSCOPY N/A 04/23/2019   Procedure: COLONOSCOPY;  Surgeon: Rogene Houston, MD;  [facility];  Service: Endoscopy;  Laterality: N/A;  930   ENDOVENOUS ABLATION SAPHENOUS VEIN W/ LASER     ENDOVENOUS ABLATION SAPHENOUS VEIN W/ LASER Left 12-21-2014   endovenous laser ablation left small saphenous vein  by Dr. Tinnie Gens    HEMORRHOID SURGERY     POLYPECTOMY  04/23/2019   Procedure: POLYPECTOMY;  Surgeon: Rogene Houston, MD;  Location: [facility];  Service: Endoscopy;;    Home Medications:  Allergies as of 11/01/2021       Reactions   Bee Venom Anaphylaxis        Medication List        Accurate as of November 01, 2021 11:34 AM. If you have  any questions, ask your nurse or doctor.          cholecalciferol 25 MCG (1000 UNIT) tablet Commonly known as: VITAMIN D3 Take 1,000 Units by mouth daily.   finasteride 5 MG tablet Commonly known as: PROSCAR TAKE 1 TABLET (5 MG TOTAL) BY MOUTH DAILY.   sildenafil 100 MG tablet Commonly known as: VIAGRA TAKE ONE TABLET BY MOUTH DAILY AS NEEDED   sulfamethoxazole-trimethoprim 800-160 MG tablet Commonly known as: BACTRIM DS Take 1 tablet by mouth every 12 (twelve) hours.   tamsulosin 0.4 MG Caps capsule Commonly known as: FLOMAX TAKE 1 CAPSULE BY MOUTH EVERY DAY        Allergies:  Allergies  Allergen Reactions   Bee Venom Anaphylaxis    Family History  Problem Relation Age of Onset   Colon cancer Maternal Aunt     Social History:  reports that he has quit smoking. He has never used smokeless tobacco. He reports current alcohol use of about 4.0 standard drinks of alcohol per week. He reports that he does not use drugs.  ROS: A complete review of systems was performed.  All systems are negative except for pertinent findings as noted.  Physical Exam:  Vital signs in last 24 hours: There were no vitals taken for this visit. Constitutional:  Alert and oriented,  No acute distress Cardiovascular: Regular rate  Respiratory: Normal respiratory effort Neurologic: Grossly intact, no focal deficits Psychiatric: Normal mood and affect  I have reviewed prior pt notes  I have reviewed notes from referring/previous physicians--ER notes reviewed  I have reviewed urinalysis results  I have independently reviewed prior imaging--bladder scan today revealed residual volume of 19 mL  I have reviewed prior PSA results     Impression/Assessment:  1.  BPH with moderate symptoms, recent episode of retention following hernia repair.  He is on dual medical therapy.  Retention resolved based on residual urine volume today.  2.  Elevated PSA with negative evaluation.  Plan:   1.  Continue dual medical therapy  2.  I will see him back at his regular appointment in December

## 2021-11-15 ENCOUNTER — Encounter: Payer: Self-pay | Admitting: Pulmonary Disease

## 2021-11-15 ENCOUNTER — Ambulatory Visit (INDEPENDENT_AMBULATORY_CARE_PROVIDER_SITE_OTHER): Payer: Medicare Other | Admitting: Pulmonary Disease

## 2021-11-15 VITALS — BP 120/62 | HR 64 | Temp 98.6°F | Ht 68.0 in | Wt 146.1 lb

## 2021-11-15 DIAGNOSIS — G4733 Obstructive sleep apnea (adult) (pediatric): Secondary | ICD-10-CM

## 2021-11-15 NOTE — Patient Instructions (Signed)
  You do not have significant sleep apnea

## 2021-11-15 NOTE — Progress Notes (Signed)
Subjective:    Patient ID: Steve Hawkins, male    DOB: 05-11-1940, 81 y.o.   MRN: [id number]  HPI  81 year old ex-smoker referred for evaluation of sleep disordered breathing. He has been diagnosed with macular degeneration and has seen Dr. Zadie Rhine in [place].  He was advised a sleep study.  He reports that his wife has not noted any snoring and feels like he "sleeps like a baby". He denies daytime somnolence, in fact reports sleepiness score is only 1 Bedtime is around 9 PM, sleep latency is minimal, he sleeps on his right side with 1 pillow, reports 1-2 nocturnal awakenings for nocturia and is out of bed at 6 AM feeling rested without dryness of mouth or headaches. His weight has fluctuated within 5 pounds of his current weight. He has a brother who weighs 350+ pounds who has sleep apnea  He smoked 1 pack/day before he quit in 2000, about 30 pack years.  PMH -BPH. Bipedal edema status post vein ablation. CT chest without contrast 2017 -stable nodules ranging from 4 mm to 10 mm compared to 2015   Significant tests/ events reviewed  08/2021 HST >> AHI 4% 3/hour AHI 3% 16/hour Lowest desaturation 88%, 3 minutes with saturation less than 88%  Past Medical History:  Diagnosis Date   Cancer ([place]) 12/2018   skin cancer-melanoma   Varicose veins    Past Surgical History:  Procedure Laterality Date   CHOLECYSTECTOMY     COLONOSCOPY N/A 04/23/2019   Procedure: COLONOSCOPY;  Surgeon: Rogene Houston, MD;  [facility];  Service: Endoscopy;  Laterality: N/A;  930   ENDOVENOUS ABLATION SAPHENOUS VEIN W/ LASER     ENDOVENOUS ABLATION SAPHENOUS VEIN W/ LASER Left 12-21-2014   endovenous laser ablation left small saphenous vein  by Dr. Tinnie Gens    HEMORRHOID SURGERY     POLYPECTOMY  04/23/2019   Procedure: POLYPECTOMY;  Surgeon: Rogene Houston, MD;  [facility];  Service: Endoscopy;;    Allergies  Allergen Reactions   Bee Venom Anaphylaxis     Social History   Socioeconomic History   Marital status: Married    Spouse name: Not on file   Number of children: Not on file   Years of education: Not on file   Highest education level: Not on file  Occupational History   Not on file  Tobacco Use   Smoking status: Former   Smokeless tobacco: Never  Vaping Use   Vaping Use: Never used  Substance and Sexual Activity   Alcohol use: Yes    Alcohol/week: 4.0 standard drinks of alcohol    Types: 1 Cans of beer, 1 Shots of liquor, 2 Standard drinks or equivalent per week   Drug use: No   Sexual activity: Not on file  Other Topics Concern   Not on file  Social History Narrative   Not on file   Social Determinants of Health   Financial Resource Strain: Not on file  Food Insecurity: Not on file  Transportation Needs: Not on file  Physical Activity: Not on file  Stress: Not on file  Social Connections: Not on file  Intimate Partner Violence: Not on file   Family History  Problem Relation Age of Onset   Colon cancer Maternal Aunt       Review of Systems Constitutional: negative for anorexia, fevers and sweats  Eyes: negative for irritation, redness and visual disturbance  Ears, nose, mouth, throat, and face: negative for earaches,  epistaxis, nasal congestion and sore throat  Respiratory: negative for cough, dyspnea on exertion, sputum and wheezing  Cardiovascular: negative for chest pain, dyspnea, orthopnea, palpitations and syncope  Gastrointestinal: negative for abdominal pain, constipation, diarrhea, melena, nausea and vomiting  Genitourinary:negative for dysuria, frequency and hematuria  Hematologic/lymphatic: negative for bleeding, easy bruising and lymphadenopathy  Musculoskeletal:negative for arthralgias, muscle weakness and stiff joints  Neurological: negative for coordination problems, gait problems, headaches and weakness  Endocrine: negative for diabetic symptoms including polydipsia, polyuria and weight  loss     Objective:   Physical Exam  Gen. Pleasant, thin man, in no distress ENT - no thrush, no pallor/icterus,no post nasal drip Neck: No JVD, no thyromegaly, no carotid bruits Lungs: no use of accessory muscles, no dullness to percussion, clear without rales or rhonchi  Cardiovascular: Rhythm regular, heart sounds  normal, no murmurs or gallops, no peripheral edema Musculoskeletal: No deformities, no cyanosis or clubbing        Assessment & Plan:    OSA -going by 4% desaturation, he does not seem to have significant events.  He does have significant 3% hypopneas within the context of absence of hypersomnolence or other symptoms I did not feel that he needs any treatment.  His oxygen desaturation index is minimal at 1/hour and lowest desaturation of 88% is not clinically significant I have reassured him.  Pulmonary nodules -seem to have been stable on this heavy ex-smoker from 20 14-20 17 and considered benign

## 2021-11-25 DIAGNOSIS — Z23 Encounter for immunization: Secondary | ICD-10-CM | POA: Diagnosis not present

## 2021-12-13 DIAGNOSIS — J069 Acute upper respiratory infection, unspecified: Secondary | ICD-10-CM | POA: Diagnosis not present

## 2021-12-13 DIAGNOSIS — J181 Lobar pneumonia, unspecified organism: Secondary | ICD-10-CM | POA: Diagnosis not present

## 2021-12-13 DIAGNOSIS — J019 Acute sinusitis, unspecified: Secondary | ICD-10-CM | POA: Diagnosis not present

## 2021-12-14 DIAGNOSIS — J019 Acute sinusitis, unspecified: Secondary | ICD-10-CM | POA: Diagnosis not present

## 2021-12-14 DIAGNOSIS — J069 Acute upper respiratory infection, unspecified: Secondary | ICD-10-CM | POA: Diagnosis not present

## 2021-12-14 DIAGNOSIS — J181 Lobar pneumonia, unspecified organism: Secondary | ICD-10-CM | POA: Diagnosis not present

## 2021-12-15 DIAGNOSIS — J181 Lobar pneumonia, unspecified organism: Secondary | ICD-10-CM | POA: Diagnosis not present

## 2021-12-29 DIAGNOSIS — J181 Lobar pneumonia, unspecified organism: Secondary | ICD-10-CM | POA: Diagnosis not present

## 2021-12-30 DIAGNOSIS — Z23 Encounter for immunization: Secondary | ICD-10-CM | POA: Diagnosis not present

## 2022-02-14 ENCOUNTER — Ambulatory Visit (INDEPENDENT_AMBULATORY_CARE_PROVIDER_SITE_OTHER): Payer: Medicare Other | Admitting: Urology

## 2022-02-14 ENCOUNTER — Encounter: Payer: Self-pay | Admitting: Urology

## 2022-02-14 VITALS — BP 131/85 | HR 62

## 2022-02-14 DIAGNOSIS — R972 Elevated prostate specific antigen [PSA]: Secondary | ICD-10-CM | POA: Diagnosis not present

## 2022-02-14 DIAGNOSIS — R339 Retention of urine, unspecified: Secondary | ICD-10-CM

## 2022-02-14 DIAGNOSIS — N5201 Erectile dysfunction due to arterial insufficiency: Secondary | ICD-10-CM

## 2022-02-14 DIAGNOSIS — N401 Enlarged prostate with lower urinary tract symptoms: Secondary | ICD-10-CM

## 2022-02-14 LAB — URINALYSIS, ROUTINE W REFLEX MICROSCOPIC
Bilirubin, UA: NEGATIVE
Glucose, UA: NEGATIVE
Ketones, UA: NEGATIVE
Nitrite, UA: NEGATIVE
Protein,UA: NEGATIVE
RBC, UA: NEGATIVE
Specific Gravity, UA: 1.02 (ref 1.005–1.030)
Urobilinogen, Ur: >8 mg/dL — ABNORMAL HIGH (ref 0.2–1.0)
pH, UA: 7 (ref 5.0–7.5)

## 2022-02-14 LAB — MICROSCOPIC EXAMINATION: Bacteria, UA: NONE SEEN

## 2022-02-14 NOTE — Progress Notes (Signed)
m  History of Present Illness:   Here for f/u of BPH, elevated PSA     February, 1999- TRUS/Bx  --PSA 5.2. Path revealed  BPH and inflammatory change. Because of this PSA elevation while on finasteride, he underwent repeat TRUS/Bx on 7.8.2013. Prostatic volume was 104 mL--all 12 cores were benign, 2 revealed acute and chronic inflammation.  12.19.2023: Here for routine follow-up.  He did present in early September following hernia surgery with history of urinary retention.  Catheter was removed the day before his appointment.  He was voiding/emptying appropriately after that.  He has been voiding well since then.  He is still on tamsulosin and finasteride.  PSA last year was quite low.    Past Medical History:  Diagnosis Date   Cancer ([place]) 12/2018   skin cancer-melanoma   Varicose veins     Past Surgical History:  Procedure Laterality Date   CHOLECYSTECTOMY     COLONOSCOPY N/A 04/23/2019   Procedure: COLONOSCOPY;  Surgeon: Rogene Houston, MD;  [facility];  Service: Endoscopy;  Laterality: N/A;  930   ENDOVENOUS ABLATION SAPHENOUS VEIN W/ LASER     ENDOVENOUS ABLATION SAPHENOUS VEIN W/ LASER Left 12-21-2014   endovenous laser ablation left small saphenous vein  by Dr. Tinnie Gens    HEMORRHOID SURGERY     POLYPECTOMY  04/23/2019   Procedure: POLYPECTOMY;  Surgeon: Rogene Houston, MD;  [facility];  Service: Endoscopy;;    Home Medications:  Allergies as of 02/14/2022       Reactions   Bee Venom Anaphylaxis        Medication List        Accurate as of February 14, 2022  8:17 AM. If you have any questions, ask your nurse or doctor.          finasteride 5 MG tablet Commonly known as: PROSCAR TAKE 1 TABLET (5 MG TOTAL) BY MOUTH DAILY.   sildenafil 100 MG tablet Commonly known as: VIAGRA TAKE ONE TABLET BY MOUTH DAILY AS NEEDED   tamsulosin 0.4 MG Caps capsule Commonly known as: FLOMAX TAKE 1 CAPSULE BY MOUTH EVERY DAY         Allergies:  Allergies  Allergen Reactions   Bee Venom Anaphylaxis    Family History  Problem Relation Age of Onset   Colon cancer Maternal Aunt     Social History:  reports that he has quit smoking. He has never used smokeless tobacco. He reports current alcohol use of about 4.0 standard drinks of alcohol per week. He reports that he does not use drugs.  ROS: A complete review of systems was performed.  All systems are negative except for pertinent findings as noted.  Physical Exam:  Vital signs in last 24 hours: There were no vitals taken for this visit. Constitutional:  Alert and oriented, No acute distress Cardiovascular: Regular rate  Respiratory: Normal respiratory effor Genitourinary: Normal anal sphincter tone.  Prostate is somewhat flat but large.  No nodularity. Lymphatic: No lymphadenopathy Neurologic: Grossly intact, no focal deficits Psychiatric: Normal mood and affect  I have reviewed prior pt notes  I have reviewed urinalysis results-clear  I have independently reviewed prior imaging-prostate ultrasound  I have reviewed prior PSA results  I have reviewed prior pathology results   Impression/Assessment:  1.  Elevated PSA with negative biopsy in the past, appropriately low and stable on finasteride  2.  BPH with symptoms, doing well with dual medical therapy which he tolerates  well  Plan:  1.  Continue dual medical therapy  2.  I will see him back in 1 year to recheck

## 2022-03-07 DIAGNOSIS — Z961 Presence of intraocular lens: Secondary | ICD-10-CM | POA: Diagnosis not present

## 2022-03-07 DIAGNOSIS — H353131 Nonexudative age-related macular degeneration, bilateral, early dry stage: Secondary | ICD-10-CM | POA: Diagnosis not present

## 2022-03-09 ENCOUNTER — Encounter (INDEPENDENT_AMBULATORY_CARE_PROVIDER_SITE_OTHER): Payer: Medicare Other | Admitting: Ophthalmology

## 2022-03-09 ENCOUNTER — Encounter (INDEPENDENT_AMBULATORY_CARE_PROVIDER_SITE_OTHER): Payer: Self-pay

## 2022-03-22 DIAGNOSIS — H35371 Puckering of macula, right eye: Secondary | ICD-10-CM | POA: Diagnosis not present

## 2022-03-22 DIAGNOSIS — H353132 Nonexudative age-related macular degeneration, bilateral, intermediate dry stage: Secondary | ICD-10-CM | POA: Diagnosis not present

## 2022-03-22 DIAGNOSIS — H43812 Vitreous degeneration, left eye: Secondary | ICD-10-CM | POA: Diagnosis not present

## 2022-03-23 DIAGNOSIS — L820 Inflamed seborrheic keratosis: Secondary | ICD-10-CM | POA: Diagnosis not present

## 2022-03-23 DIAGNOSIS — X32XXXD Exposure to sunlight, subsequent encounter: Secondary | ICD-10-CM | POA: Diagnosis not present

## 2022-03-23 DIAGNOSIS — L570 Actinic keratosis: Secondary | ICD-10-CM | POA: Diagnosis not present

## 2022-04-20 DIAGNOSIS — L570 Actinic keratosis: Secondary | ICD-10-CM | POA: Diagnosis not present

## 2022-04-20 DIAGNOSIS — D225 Melanocytic nevi of trunk: Secondary | ICD-10-CM | POA: Diagnosis not present

## 2022-04-20 DIAGNOSIS — Z08 Encounter for follow-up examination after completed treatment for malignant neoplasm: Secondary | ICD-10-CM | POA: Diagnosis not present

## 2022-04-20 DIAGNOSIS — Z85828 Personal history of other malignant neoplasm of skin: Secondary | ICD-10-CM | POA: Diagnosis not present

## 2022-04-20 DIAGNOSIS — X32XXXD Exposure to sunlight, subsequent encounter: Secondary | ICD-10-CM | POA: Diagnosis not present

## 2022-04-20 DIAGNOSIS — L820 Inflamed seborrheic keratosis: Secondary | ICD-10-CM | POA: Diagnosis not present

## 2022-04-20 DIAGNOSIS — Z1283 Encounter for screening for malignant neoplasm of skin: Secondary | ICD-10-CM | POA: Diagnosis not present

## 2022-05-16 ENCOUNTER — Other Ambulatory Visit: Payer: Self-pay | Admitting: Urology

## 2022-05-16 DIAGNOSIS — N401 Enlarged prostate with lower urinary tract symptoms: Secondary | ICD-10-CM

## 2022-08-15 ENCOUNTER — Telehealth: Payer: Self-pay

## 2022-08-15 ENCOUNTER — Other Ambulatory Visit: Payer: Self-pay | Admitting: Urology

## 2022-08-15 DIAGNOSIS — N529 Male erectile dysfunction, unspecified: Secondary | ICD-10-CM

## 2022-08-15 MED ORDER — SILDENAFIL CITRATE 100 MG PO TABS: ORAL_TABLET | ORAL | 11 refills | Status: DC

## 2022-08-15 NOTE — Telephone Encounter (Signed)
Patient called and advised they needed a refill on medication below.   Medication: sildenafil (VIAGRA) 100 MG tablet    Pharmacy: [facility] [id number] - [address] [place] - [address] [facility] [address]   Thank you

## 2022-08-17 DIAGNOSIS — G4733 Obstructive sleep apnea (adult) (pediatric): Secondary | ICD-10-CM | POA: Diagnosis not present

## 2022-08-17 DIAGNOSIS — Z79899 Other long term (current) drug therapy: Secondary | ICD-10-CM | POA: Diagnosis not present

## 2022-08-17 DIAGNOSIS — R001 Bradycardia, unspecified: Secondary | ICD-10-CM | POA: Diagnosis not present

## 2022-08-17 DIAGNOSIS — N400 Enlarged prostate without lower urinary tract symptoms: Secondary | ICD-10-CM | POA: Diagnosis not present

## 2022-08-17 DIAGNOSIS — M5186 Other intervertebral disc disorders, lumbar region: Secondary | ICD-10-CM | POA: Diagnosis not present

## 2022-08-24 DIAGNOSIS — R001 Bradycardia, unspecified: Secondary | ICD-10-CM | POA: Diagnosis not present

## 2022-08-24 DIAGNOSIS — N400 Enlarged prostate without lower urinary tract symptoms: Secondary | ICD-10-CM | POA: Diagnosis not present

## 2022-08-24 DIAGNOSIS — Z8520 Personal history of malignant neoplasm of unspecified respiratory organ: Secondary | ICD-10-CM | POA: Diagnosis not present

## 2022-08-24 DIAGNOSIS — Z0001 Encounter for general adult medical examination with abnormal findings: Secondary | ICD-10-CM | POA: Diagnosis not present

## 2022-09-04 DIAGNOSIS — H35371 Puckering of macula, right eye: Secondary | ICD-10-CM | POA: Diagnosis not present

## 2022-09-04 DIAGNOSIS — H43812 Vitreous degeneration, left eye: Secondary | ICD-10-CM | POA: Diagnosis not present

## 2022-09-04 DIAGNOSIS — H353132 Nonexudative age-related macular degeneration, bilateral, intermediate dry stage: Secondary | ICD-10-CM | POA: Diagnosis not present

## 2022-09-06 DIAGNOSIS — B349 Viral infection, unspecified: Secondary | ICD-10-CM | POA: Diagnosis not present

## 2022-09-06 DIAGNOSIS — J069 Acute upper respiratory infection, unspecified: Secondary | ICD-10-CM | POA: Diagnosis not present

## 2022-09-06 DIAGNOSIS — J209 Acute bronchitis, unspecified: Secondary | ICD-10-CM | POA: Diagnosis not present

## 2022-09-06 DIAGNOSIS — J019 Acute sinusitis, unspecified: Secondary | ICD-10-CM | POA: Diagnosis not present

## 2022-09-07 ENCOUNTER — Encounter (HOSPITAL_COMMUNITY): Payer: Self-pay | Admitting: *Deleted

## 2022-09-07 ENCOUNTER — Emergency Department (HOSPITAL_COMMUNITY): Payer: Medicare Other

## 2022-09-07 ENCOUNTER — Telehealth: Payer: Self-pay | Admitting: Pulmonary Disease

## 2022-09-07 ENCOUNTER — Other Ambulatory Visit: Payer: Self-pay

## 2022-09-07 ENCOUNTER — Emergency Department (HOSPITAL_COMMUNITY)
Admission: EM | Admit: 2022-09-07 | Discharge: 2022-09-07 | Disposition: A | Discharge status: Home / Self Care | Payer: Medicare Other | Attending: Student | Admitting: Student

## 2022-09-07 ENCOUNTER — Telehealth (INDEPENDENT_AMBULATORY_CARE_PROVIDER_SITE_OTHER): Payer: Self-pay | Admitting: Gastroenterology

## 2022-09-07 DIAGNOSIS — J189 Pneumonia, unspecified organism: Secondary | ICD-10-CM | POA: Diagnosis not present

## 2022-09-07 DIAGNOSIS — I700 Atherosclerosis of aorta: Secondary | ICD-10-CM | POA: Insufficient documentation

## 2022-09-07 DIAGNOSIS — S0990XA Unspecified injury of head, initial encounter: Secondary | ICD-10-CM | POA: Diagnosis not present

## 2022-09-07 DIAGNOSIS — S199XXA Unspecified injury of neck, initial encounter: Secondary | ICD-10-CM | POA: Diagnosis not present

## 2022-09-07 DIAGNOSIS — Z85828 Personal history of other malignant neoplasm of skin: Secondary | ICD-10-CM | POA: Insufficient documentation

## 2022-09-07 DIAGNOSIS — R059 Cough, unspecified: Secondary | ICD-10-CM | POA: Insufficient documentation

## 2022-09-07 DIAGNOSIS — Z87891 Personal history of nicotine dependence: Secondary | ICD-10-CM | POA: Diagnosis not present

## 2022-09-07 DIAGNOSIS — J690 Pneumonitis due to inhalation of food and vomit: Secondary | ICD-10-CM | POA: Diagnosis not present

## 2022-09-07 DIAGNOSIS — J439 Emphysema, unspecified: Secondary | ICD-10-CM | POA: Insufficient documentation

## 2022-09-07 DIAGNOSIS — R55 Syncope and collapse: Secondary | ICD-10-CM | POA: Diagnosis not present

## 2022-09-07 DIAGNOSIS — J432 Centrilobular emphysema: Secondary | ICD-10-CM | POA: Diagnosis not present

## 2022-09-07 DIAGNOSIS — R42 Dizziness and giddiness: Secondary | ICD-10-CM | POA: Diagnosis not present

## 2022-09-07 DIAGNOSIS — R519 Headache, unspecified: Secondary | ICD-10-CM | POA: Diagnosis not present

## 2022-09-07 DIAGNOSIS — R590 Localized enlarged lymph nodes: Secondary | ICD-10-CM | POA: Diagnosis not present

## 2022-09-07 DIAGNOSIS — R918 Other nonspecific abnormal finding of lung field: Secondary | ICD-10-CM | POA: Diagnosis not present

## 2022-09-07 LAB — COMPREHENSIVE METABOLIC PANEL
ALT: 13 U/L (ref 0–44)
AST: 14 U/L — ABNORMAL LOW (ref 15–41)
Albumin: 2.9 g/dL — ABNORMAL LOW (ref 3.5–5.0)
Alkaline Phosphatase: 64 U/L (ref 38–126)
Anion gap: 8 (ref 5–15)
BUN: 18 mg/dL (ref 8–23)
CO2: 22 mmol/L (ref 22–32)
Calcium: 7.9 mg/dL — ABNORMAL LOW (ref 8.9–10.3)
Chloride: 99 mmol/L (ref 98–111)
Creatinine, Ser: 0.77 mg/dL (ref 0.61–1.24)
GFR, Estimated: >60 mL/min (ref >60)
Glucose, Bld: 117 mg/dL — ABNORMAL HIGH (ref 70–99)
Potassium: 3.8 mmol/L (ref 3.5–5.1)
Sodium: 129 mmol/L — ABNORMAL LOW (ref 135–145)
Total Bilirubin: 1.3 mg/dL — ABNORMAL HIGH (ref 0.3–1.2)
Total Protein: 5.9 g/dL — ABNORMAL LOW (ref 6.5–8.1)

## 2022-09-07 LAB — CBC WITH DIFFERENTIAL/PLATELET
Abs Immature Granulocytes: 0.06 10*3/uL (ref 0.00–0.07)
Basophils Absolute: 0 10*3/uL (ref 0.0–0.1)
Basophils Relative: 0 %
Eosinophils Absolute: 0 10*3/uL (ref 0.0–0.5)
Eosinophils Relative: 0 %
HCT: 38.9 % — ABNORMAL LOW (ref 39.0–52.0)
Hemoglobin: 13.6 g/dL (ref 13.0–17.0)
Immature Granulocytes: 0 %
Lymphocytes Relative: 5 %
Lymphs Abs: 0.7 10*3/uL (ref 0.7–4.0)
MCH: 34.5 pg — ABNORMAL HIGH (ref 26.0–34.0)
MCHC: 35 g/dL (ref 30.0–36.0)
MCV: 98.7 fL (ref 80.0–100.0)
Monocytes Absolute: 2.4 10*3/uL — ABNORMAL HIGH (ref 0.1–1.0)
Monocytes Relative: 17 %
Neutro Abs: 11.3 10*3/uL — ABNORMAL HIGH (ref 1.7–7.7)
Neutrophils Relative %: 78 %
Platelets: 211 10*3/uL (ref 150–400)
RBC: 3.94 MIL/uL — ABNORMAL LOW (ref 4.22–5.81)
RDW: 12 % (ref 11.5–15.5)
WBC: 14.5 10*3/uL — ABNORMAL HIGH (ref 4.0–10.5)
nRBC: 0 % (ref 0.0–0.2)

## 2022-09-07 MED ORDER — LACTATED RINGERS IV BOLUS
1000.0000 mL | Freq: Once | INTRAVENOUS | Status: AC
  Administered 2022-09-07: 1000 mL via INTRAVENOUS

## 2022-09-07 MED ORDER — SODIUM CHLORIDE 0.9 % IV SOLN
500.0000 mg | INTRAVENOUS | Status: DC
  Administered 2022-09-07: 500 mg via INTRAVENOUS
  Filled 2022-09-07: qty 5

## 2022-09-07 MED ORDER — SODIUM CHLORIDE 0.9 % IV SOLN
2.0000 g | Freq: Once | INTRAVENOUS | Status: AC
  Administered 2022-09-07: 2 g via INTRAVENOUS
  Filled 2022-09-07: qty 20

## 2022-09-07 MED ORDER — AMOXICILLIN-POT CLAVULANATE 400-57 MG/5ML PO SUSR
875.0000 mg | Freq: Two times a day (BID) | ORAL | 0 refills | Status: AC
Start: 2022-09-07 — End: 2022-09-21

## 2022-09-07 MED ORDER — IOHEXOL 300 MG/ML  SOLN
75.0000 mL | Freq: Once | INTRAMUSCULAR | Status: AC | PRN
Start: 2022-09-07 — End: 2022-09-07
  Administered 2022-09-07: 75 mL via INTRAVENOUS

## 2022-09-07 NOTE — ED Provider Notes (Signed)
[place] EMERGENCY DEPARTMENT AT [facility] Provider Note  CSN: [id number] Arrival date & time: 09/07/22 7829  Chief Complaint(s) Dizziness  HPI Steve Hawkins is a 82 y.o. male who presents emergency room for evaluation of a syncopal episode.  Patient states that he went to urgent care yesterday for a persistent cough who did not perform an x-ray, diagnosed him with bronchitis and empirically put him on doxycycline.  Doxycycline reportedly caused an episode of vomiting today and he had an associated syncopal event after using the restroom, falling to the ground striking his head on the ground.  He does state that over the last few months he has had difficulty swallowing and has not had this evaluated.  He currently denies chest pain, shortness of breath, abdominal pain, nausea or other systemic symptoms.   Past Medical History Past Medical History:  Diagnosis Date   Cancer ([facility]) 12/2018   skin cancer-melanoma   Varicose veins    Patient Active Problem List   Diagnosis Date Noted   Posterior vitreous detachment of left eye 07/01/2020   Intermediate stage nonexudative age-related macular degeneration of both eyes 03/29/2020   Macular pucker, right eye 03/29/2020   Special screening for malignant neoplasms, colon 11/06/2018   Family history of colon cancer 11/06/2018   Varicose veins of left lower extremity with complications 11/17/2014   Pain in limb 03/18/2012   Phlebitis and thrombophlebitis of superficial vessels of lower extremities 03/18/2012   Varicose veins of lower extremities with other complications 03/18/2012   DIVERTICULOSIS, COLON 06/05/2008   HEMORRHOIDS, EXTERNAL 06/04/2008   RECTAL BLEEDING, HX OF 06/04/2008   Home Medication(s) Prior to Admission medications   Medication Sig Start Date End Date Taking? Authorizing Provider  doxycycline (VIBRAMYCIN) 100 MG capsule Take 100 mg by mouth 2 (two) times daily. 09/06/22  Yes [provider]   finasteride (PROSCAR) 5 MG tablet TAKE 1 TABLET (5 MG TOTAL) BY MOUTH DAILY. 05/16/22  Yes Dahlstedt, Jeannett Senior, MD  Multiple Vitamins-Minerals (PRESERVISION AREDS 2) CAPS Take 1 capsule by mouth in the morning and at bedtime. 03/07/22  Yes [provider]  sildenafil (VIAGRA) 100 MG tablet 1/2 to 1 tablet p.o. as needed Patient taking differently: Take 50-100 mg by mouth as needed for erectile dysfunction. 08/15/22  Yes Dahlstedt, Jeannett Senior, MD  tamsulosin (FLOMAX) 0.4 MG CAPS capsule TAKE 1 CAPSULE BY MOUTH EVERY DAY Patient taking differently: Take 0.4 mg by mouth daily. 05/16/22  Yes Marcine Matar, MD                                                                                                                                    Past Surgical History Past Surgical History:  Procedure Laterality Date   CHOLECYSTECTOMY     COLONOSCOPY N/A 04/23/2019   Procedure: COLONOSCOPY;  Surgeon: Malissa Hippo, MD;  [facility];  Service: Endoscopy;  Laterality: N/A;  930  ENDOVENOUS ABLATION SAPHENOUS VEIN W/ LASER     ENDOVENOUS ABLATION SAPHENOUS VEIN W/ LASER Left 12-21-2014   endovenous laser ablation left small saphenous vein  by Dr. Josephina Gip    HEMORRHOID SURGERY     POLYPECTOMY  04/23/2019   Procedure: POLYPECTOMY;  Surgeon: Malissa Hippo, MD;  [facility];  Service: Endoscopy;;   Family History Family History  Problem Relation Age of Onset   Colon cancer Maternal Aunt     Social History Social History   Tobacco Use   Smoking status: Former   Smokeless tobacco: Never  Vaping Use   Vaping status: Never Used  Substance Use Topics   Alcohol use: Yes    Alcohol/week: 4.0 standard drinks of alcohol    Types: 1 Cans of beer, 1 Shots of liquor, 2 Standard drinks or equivalent per week   Drug use: No   Allergies Bee venom  Review of Systems Review of Systems  Respiratory:  Positive for cough.   Neurological:  Positive for syncope.     Physical Exam Vital Signs  I have reviewed the triage vital signs BP 119/73   Pulse 85   Temp 98.8 F (37.1 C) (Oral)   Resp 20   Ht 5' 8.5" (1.74 m)   Wt 62.1 kg   SpO2 92%   BMI 20.53 kg/m   Physical Exam  ED Results and Treatments Labs (all labs ordered are listed, but only abnormal results are displayed) Labs Reviewed  COMPREHENSIVE METABOLIC PANEL - Abnormal; Notable for the following components:      Result Value   Sodium 129 (*)    Glucose, Bld 117 (*)    Calcium 7.9 (*)    Total Protein 5.9 (*)    Albumin 2.9 (*)    AST 14 (*)    Total Bilirubin 1.3 (*)    All other components within normal limits  CBC WITH DIFFERENTIAL/PLATELET - Abnormal; Notable for the following components:   WBC 14.5 (*)    RBC 3.94 (*)    HCT 38.9 (*)    MCH 34.5 (*)    Neutro Abs 11.3 (*)    Monocytes Absolute 2.4 (*)    All other components within normal limits                                                                                                                          Radiology CT HEAD WO CONTRAST ( )  Result Date: 09/07/2022 CLINICAL DATA:  Dizziness, head trauma, weakness, headaches. EXAM: CT HEAD WITHOUT CONTRAST CT CERVICAL SPINE WITHOUT CONTRAST TECHNIQUE: Multidetector CT imaging of the head and cervical spine was performed following the standard protocol without intravenous contrast. Multiplanar CT image reconstructions of the cervical spine were also generated. RADIATION DOSE REDUCTION: This exam was performed according to the departmental dose-optimization program which includes automated exposure control, adjustment of the mA and/or kV according to patient size and/or use of iterative reconstruction technique. COMPARISON:  None  Available. FINDINGS: CT HEAD FINDINGS Brain: There is no acute intracranial hemorrhage, extra-axial fluid collection, or acute infarct Parenchymal volume is normal for age. The ventricles are normal in size. Gray-white differentiation is  preserved. Patchy hypodensity in the supratentorial white matter likely reflects sequela of underlying chronic small-vessel ischemic change. The pituitary and suprasellar region are normal. There is no mass lesion. There is no mass effect or midline shift. Vascular: No hyperdense vessel or unexpected calcification. Skull: Normal. Negative for fracture or focal lesion. Sinuses/Orbits: The imaged paranasal sinuses are clear. Bilateral lens implants are in place. The globes and orbits are otherwise unremarkable. Other: The mastoid air cells and middle ear cavities are clear. CT CERVICAL SPINE FINDINGS Alignment: Normal. Skull base and vertebrae: Skull base alignment is maintained. Vertebral body heights are preserved. There is no evidence of acute fracture. There is no suspicious osseous lesion. Soft tissues and spinal canal: No prevertebral fluid or swelling. No visible canal hematoma. Disc levels: There is overall mild multilevel degenerative change of the cervical spine, with disc space narrowing and degenerative endplate change most advanced at C6-C7. There is no high-grade spinal canal stenosis. Upper chest: There is scarring in the lung apices. There is debris in the trachea. Other: None. IMPRESSION: 1. No acute intracranial pathology. 2. No acute fracture or traumatic malalignment of the cervical spine. 3. Debris in the trachea. Correlate with any signs or symptoms of aspiration. Electronically Signed   By: Lesia Hausen M.D.   On: 09/07/2022 10:31   CT CERVICAL SPINE WO CONTRAST  Result Date: 09/07/2022 CLINICAL DATA:  Dizziness, head trauma, weakness, headaches. EXAM: CT HEAD WITHOUT CONTRAST CT CERVICAL SPINE WITHOUT CONTRAST TECHNIQUE: Multidetector CT imaging of the head and cervical spine was performed following the standard protocol without intravenous contrast. Multiplanar CT image reconstructions of the cervical spine were also generated. RADIATION DOSE REDUCTION: This exam was performed according to  the departmental dose-optimization program which includes automated exposure control, adjustment of the mA and/or kV according to patient size and/or use of iterative reconstruction technique. COMPARISON:  None Available. FINDINGS: CT HEAD FINDINGS Brain: There is no acute intracranial hemorrhage, extra-axial fluid collection, or acute infarct Parenchymal volume is normal for age. The ventricles are normal in size. Gray-white differentiation is preserved. Patchy hypodensity in the supratentorial white matter likely reflects sequela of underlying chronic small-vessel ischemic change. The pituitary and suprasellar region are normal. There is no mass lesion. There is no mass effect or midline shift. Vascular: No hyperdense vessel or unexpected calcification. Skull: Normal. Negative for fracture or focal lesion. Sinuses/Orbits: The imaged paranasal sinuses are clear. Bilateral lens implants are in place. The globes and orbits are otherwise unremarkable. Other: The mastoid air cells and middle ear cavities are clear. CT CERVICAL SPINE FINDINGS Alignment: Normal. Skull base and vertebrae: Skull base alignment is maintained. Vertebral body heights are preserved. There is no evidence of acute fracture. There is no suspicious osseous lesion. Soft tissues and spinal canal: No prevertebral fluid or swelling. No visible canal hematoma. Disc levels: There is overall mild multilevel degenerative change of the cervical spine, with disc space narrowing and degenerative endplate change most advanced at C6-C7. There is no high-grade spinal canal stenosis. Upper chest: There is scarring in the lung apices. There is debris in the trachea. Other: None. IMPRESSION: 1. No acute intracranial pathology. 2. No acute fracture or traumatic malalignment of the cervical spine. 3. Debris in the trachea. Correlate with any signs or symptoms of aspiration. Electronically Signed  By: Lesia Hausen M.D.   On: 09/07/2022 10:31    Pertinent labs &  imaging results that were available during my care of the patient were reviewed by me and considered in my medical decision making (see MDM for details).  Medications Ordered in ED Medications  iohexol (OMNIPAQUE) 300 MG/ML solution 75 mL (has no administration in time range)  lactated ringers bolus 1,000 mL (1,000 mLs Intravenous New Bag/Given 09/07/22 1023)                                                                                                                                     Procedures Procedures  (including critical care time)  Medical Decision Making / ED Course   This patient presents to the ED for concern of cough, syncope, this involves an extensive number of treatment options, and is a complaint that carries with it a high risk of complications and morbidity.  The differential diagnosis includes orthostatic syncope, cardiogenic syncope, vasovagal syncope, electrolyte abnormality, dehydration, dysrhythmia, vasovagal, Hypoglycemia, Seizure, Autonomic Insufficiency, aspiration pneumonia, mass  MDM: Patient seen emergency room for evaluation of multiple complaints including cough and syncope.  Physical exam with rales at the bases on the left but is otherwise unremarkable.  Laboratory evaluation with a leukocytosis to 14.5, mild hyponatremia 129, mild hypoalbuminemia to 2.9, total bili 1.3.  CT head and C-spine reassuringly negative for acute traumatic injury from his fall today.  However, there does appear to be debris in the trachea seen incidentally on the CT cervical spine and thus I expanded his workup with a CT chest.  CT chest is concerning for a large 6.7 cm consolidative nodular opacity in the left lung base that is likely aspiration pneumonia but cannot rule out malignancy.  I spoke with the pulmonologist on-call Dr. Francine Graven who is recommending 2 weeks of Augmentin and close outpatient pulmonology follow-up.  Given that patient is endorsing history of dysphagia I suspect  that this is likely the underlying source of his aspiration pneumonia and I spoke with the gastroenterologist on-call Dr. Tasia Catchings who also will call the patient to establish close outpatient follow-up and is not recommending inpatient scope in the setting of an active pneumonia.  Patient is not hypoxic, does not complain of any shortness of breath at this time and thus does not meet inpatient criteria for admission for his aspiration pneumonia.  Ceftriaxone and azithromycin given single dose here in the ER.  He will be discharged on Augmentin with close outpatient pulmonology and GI follow-up with strict return precautions of which he and his daughter voiced understanding.   Additional history obtained: -Additional history obtained from daughter -External records from outside source obtained and reviewed including: Chart review including previous notes, labs, imaging, consultation notes   Lab Tests: -I ordered, reviewed, and interpreted labs.   The pertinent results include:   Labs Reviewed  COMPREHENSIVE METABOLIC PANEL - Abnormal; Notable for the  following components:      Result Value   Sodium 129 (*)    Glucose, Bld 117 (*)    Calcium 7.9 (*)    Total Protein 5.9 (*)    Albumin 2.9 (*)    AST 14 (*)    Total Bilirubin 1.3 (*)    All other components within normal limits  CBC WITH DIFFERENTIAL/PLATELET - Abnormal; Notable for the following components:   WBC 14.5 (*)    RBC 3.94 (*)    HCT 38.9 (*)    MCH 34.5 (*)    Neutro Abs 11.3 (*)    Monocytes Absolute 2.4 (*)    All other components within normal limits    Imaging Studies ordered: I ordered imaging studies including CT head, C-spine, chest abdomen pelvis I independently visualized and interpreted imaging. I agree with the radiologist interpretation   Medicines ordered and prescription drug management: Meds ordered this encounter  Medications   lactated ringers bolus 1,000 mL   iohexol (OMNIPAQUE) 300 MG/ML solution 75  mL    -I have reviewed the patients home medicines and have made adjustments as needed  Critical interventions none  Consultations Obtained: I requested consultation with the pulmonologist and gastroenterologist on-call,  and discussed lab and imaging findings as well as pertinent plan - they recommend: Close outpatient follow-up   Cardiac Monitoring: The patient was maintained on a cardiac monitor.  I personally viewed and interpreted the cardiac monitored which showed an underlying rhythm of: NSR  Social Determinants of Health:  Factors impacting patients care include: none   Reevaluation: After the interventions noted above, I reevaluated the patient and found that they have :improved  Co morbidities that complicate the patient evaluation  Past Medical History:  Diagnosis Date   Cancer ([facility]) 12/2018   skin cancer-melanoma   Varicose veins       Dispostion: I considered admission for this patient, but at this time he does not meet inpatient criteria for admission and he is safe for discharge with close outpatient gastroenterology and pulmonology follow-up     Final Clinical Impression(s) / ED Diagnoses Final diagnoses:  None     @PCDICTATION @    Glendora Score, MD 09/07/22 2142

## 2022-09-07 NOTE — ED Triage Notes (Signed)
Pt states he was diagnosed with bronchitis yesterday and given doxycycline  Pt states he got dizzy and fell while in the bathroom and hit his head; pt denies any loc  Pt states he feels weak and has a headache

## 2022-09-07 NOTE — Telephone Encounter (Signed)
GI Note :  Spoke with Dr. Posey Rea regarding this patient's intermittent dysphagia . Patient is seen in ED for Pneumonia . Given patient is being treated for pneumonia and able to tolerate PO diet , we can see the patient in GI clinic to discuss further management .  I have messaged our clinic to arrange for follow up.   Steve Hawkins [facility] Attending

## 2022-09-07 NOTE — Telephone Encounter (Signed)
Follow up should be with Dr. Tonia Brooms or Dr. Delton Coombes as he may need avanced bronchoscopy.

## 2022-09-07 NOTE — Telephone Encounter (Signed)
Please schedule patient for ER follow up from [facility] for Left Lower Lobe consolidation vs mass. He should be scheduled in 2-4 weeks.  Thanks, JD

## 2022-09-07 NOTE — Progress Notes (Signed)
PCCM Note:  Spoke with Dr. Posey Rea regarding this patient's CT Chest findings. Recommend 2 week augmentin 875mg  BID course for concern of aspiration. I have messaged our clinic to arrange for follow up to determine need for bronchoscopy/biopsies in the future.   Melody Comas, MD [facility] Office: [phone]   See Amion for personal pager PCCM on call pager [phone] until 7pm. Please call Elink 7p-7a. [phone]

## 2022-09-11 ENCOUNTER — Encounter: Payer: Self-pay | Admitting: Gastroenterology

## 2022-09-11 NOTE — Telephone Encounter (Signed)
An appt was already scheduled with Rhunette Croft, NP. Called and spoke to pt to have appt changed to RB or BI. Appt scheduled with Dr. Delton Coombes for 7/30 at 1:00pm. Patient verbalized understanding. Patient is aware of the change of location, address given for [address]. Pt verbalized understanding and denied any further questions or concerns at this time.

## 2022-09-13 ENCOUNTER — Telehealth: Payer: Self-pay

## 2022-09-13 NOTE — Telephone Encounter (Signed)
Transition Care Management Follow-up Telephone Call Date of discharge and from where: 09/07/2022 [facility] How have you been since you were released from the hospital? Patient is feeling better. Any questions or concerns? No  Items Reviewed: Did the pt receive and understand the discharge instructions provided? Yes  Medications obtained and verified? Yes  Other? No  Any new allergies since your discharge? No  Dietary orders reviewed? Yes Do you have support at home? Yes   Follow up appointments reviewed:  PCP Hospital f/u appt confirmed? No  Scheduled to see  on  @ . Specialist Hospital f/u appt confirmed? Yes  Scheduled to see Levy Pupa, MD on 09/26/2022 @ [facility] at [facility]. Are transportation arrangements needed? No  If their condition worsens, is the pt aware to call PCP or go to the Emergency Dept.? Yes Was the patient provided with contact information for the PCP's office or ED? Yes Was to pt encouraged to call back with questions or concerns? Yes  Jadakiss Barish [facility]  [facility] Population Health Community Resource Care Guide   [email or web address]  ?? [phone]   Website: [email or web address]

## 2022-09-25 ENCOUNTER — Inpatient Hospital Stay (HOSPITAL_BASED_OUTPATIENT_CLINIC_OR_DEPARTMENT_OTHER): Payer: Medicare Other | Admitting: Nurse Practitioner

## 2022-09-26 ENCOUNTER — Ambulatory Visit (INDEPENDENT_AMBULATORY_CARE_PROVIDER_SITE_OTHER): Payer: Medicare Other | Admitting: Emergency Medicine

## 2022-09-26 ENCOUNTER — Encounter: Payer: Self-pay | Admitting: Emergency Medicine

## 2022-09-26 VITALS — BP 110/60 | HR 74 | Temp 97.4°F | Ht 68.5 in | Wt 145.0 lb

## 2022-09-26 DIAGNOSIS — R1310 Dysphagia, unspecified: Secondary | ICD-10-CM

## 2022-09-26 DIAGNOSIS — R9389 Abnormal findings on diagnostic imaging of other specified body structures: Secondary | ICD-10-CM

## 2022-09-26 DIAGNOSIS — J189 Pneumonia, unspecified organism: Secondary | ICD-10-CM | POA: Diagnosis not present

## 2022-09-26 DIAGNOSIS — T17328S Food in larynx causing other injury, sequela: Secondary | ICD-10-CM

## 2022-09-26 NOTE — Assessment & Plan Note (Signed)
He states that he has had some intermittent aspiration with food and liquid.  Also possibly some food getting stuck.  He has a follow-up arranged with gastroenterology next month.  I will order a modified barium swallow so that data will be available when he has that appointment.

## 2022-09-26 NOTE — Patient Instructions (Signed)
We will arrange for swallowing evaluation to be done at [facility] to ensure that you are not aspirating Follow-up with gastroenterology as planned We will repeat your CT scan of the chest at [facility] in late August Please follow Dr. Delton Coombes in September after your CT chest so we can review those results together

## 2022-09-26 NOTE — Assessment & Plan Note (Signed)
Infiltrate in the left lower lobe with air bronchograms, consistent with pneumonia.  He is high risk for aspiration pneumonia based on his description of symptoms.  I will plan to repeat his CT scan of the chest at around the 6-8-week mark to ensure resolution of his infiltrate.  If it persists or if there is any residual abnormality suspicious for malignancy then we can talk about diagnostics

## 2022-09-26 NOTE — Progress Notes (Signed)
Subjective:    Patient ID: Steve Hawkins, male    DOB: [birth year], 82 y.o.   MRN: [id number]  HPI 82 year old former smoker (50 pk-yrs) with a history of melanoma.  He was seen in the emergency department 09/07/2022 after syncopal episode.  Has been having persistent cough and was treated with empiric doxycycline.  Apparently he had also been experiencing some dysphagia.  A CT scan of his chest was done 7/11 as below, showed a 6.7 cm nodular enhancing opacity at the left base suggestive of possible aspiration pneumonia.  There was some endobronchial plugging in the left lower lobe airways.  Also centrilobular emphysematous change.  Also prominent mediastinal and hilar left sided lymph nodes.  He was started on Augmentin for possible aspiration pneumonia.  He is referred for further evaluation of his abnormal CT. He reports that he is feel better - has completed the augmenting. Still some fatigue, cough is better. This is his second PNA this year. He has occasional aspiration sx with food or drink. Sometimes food gets stuck - he is scheduled to see GI next month.  He denies SOB, he was able to do yard work after the PNA improved   Review of Systems As per HPI  Past Medical History:  Diagnosis Date   Cancer (HCC) 12/2018   skin cancer-melanoma   Varicose veins      Family History  Problem Relation Age of Onset   Colon cancer Maternal Aunt    No hx lung CA in family  Social History   Socioeconomic History   Marital status: Married    Spouse name: Not on file   Number of children: Not on file   Years of education: Not on file   Highest education level: Not on file  Occupational History   Not on file  Tobacco Use   Smoking status: Former   Smokeless tobacco: Never  Vaping Use   Vaping status: Never Used  Substance and Sexual Activity   Alcohol use: Yes    Alcohol/week: 4.0 standard drinks of alcohol    Types: 1 Cans of beer, 1 Shots of liquor, 2 Standard drinks or  equivalent per week   Drug use: No   Sexual activity: Not on file  Other Topics Concern   Not on file  Social History Narrative   Not on file   Social Determinants of Health   Financial Resource Strain: Not on file  Food Insecurity: Not on file  Transportation Needs: Not on file  Physical Activity: Not on file  Stress: Not on file  Social Connections: Not on file  Intimate Partner Violence: Not on file    He did office job for [employer]  No military [place] native  Allergies  Allergen Reactions   Bee Venom Anaphylaxis     Outpatient Medications Prior to Visit  Medication Sig Dispense Refill   finasteride (PROSCAR) 5 MG tablet TAKE 1 TABLET (5 MG TOTAL) BY MOUTH DAILY. 90 tablet 3   Multiple Vitamins-Minerals (PRESERVISION AREDS 2) CAPS Take 1 capsule by mouth in the morning and at bedtime.     sildenafil (VIAGRA) 100 MG tablet 1/2 to 1 tablet p.o. as needed (Patient taking differently: Take 50-100 mg by mouth as needed for erectile dysfunction.) 30 tablet 11   tamsulosin (FLOMAX) 0.4 MG CAPS capsule TAKE 1 CAPSULE BY MOUTH EVERY DAY (Patient taking differently: Take 0.4 mg by mouth daily.) 90 capsule 3   No facility-administered medications prior to visit.  Objective:   Physical Exam  Vitals:   09/26/22 1305  BP: 110/60  Pulse: 74  Temp: (!) 97.4 F (36.3 C)  TempSrc: Temporal  SpO2: 96%  Weight: 145 lb (65.8 kg)  Height: 5' 8.5" (1.74 m)    Gen: Pleasant, thin, in no distress,  normal affect  ENT: No lesions,  mouth clear,  oropharynx clear, no postnasal drip  Neck: No JVD, no stridor  Lungs: No use of accessory muscles, slightly decreased at the left base, no crackles or wheezing on normal respiration, no wheeze on forced expiration  Cardiovascular: RRR, heart sounds normal, no murmur or gallops, no peripheral edema  Musculoskeletal: No deformities, no cyanosis or clubbing  Neuro: alert, awake, non focal  Skin: Warm, no lesions or  rash     Assessment & Plan:   Abnormal CT of the chest Infiltrate in the left lower lobe with air bronchograms, consistent with pneumonia.  He is high risk for aspiration pneumonia based on his description of symptoms.  I will plan to repeat his CT scan of the chest at around the 6-8-week mark to ensure resolution of his infiltrate.  If it persists or if there is any residual abnormality suspicious for malignancy then we can talk about diagnostics  Dysphagia He states that he has had some intermittent aspiration with food and liquid.  Also possibly some food getting stuck.  He has a follow-up arranged with gastroenterology next month.  I will order a modified barium swallow so that data will be available when he has that appointment.   Levy Pupa, MD, PhD 09/26/2022, 1:53 PM [facility] [phone] or if no answer before 7:00PM call [phone] For any issues after 7:00PM please call eLink [phone]

## 2022-09-27 ENCOUNTER — Telehealth: Payer: Self-pay | Admitting: Emergency Medicine

## 2022-09-27 NOTE — Telephone Encounter (Signed)
Patient stated he is returning a call.  Please call patient back.

## 2022-10-03 ENCOUNTER — Encounter (HOSPITAL_COMMUNITY): Payer: Self-pay

## 2022-10-03 ENCOUNTER — Other Ambulatory Visit: Payer: Self-pay

## 2022-10-03 ENCOUNTER — Emergency Department (HOSPITAL_COMMUNITY)
Admission: EM | Admit: 2022-10-03 | Discharge: 2022-10-03 | Disposition: A | Discharge status: Home / Self Care | Payer: Medicare Other | Source: Home / Self Care | Attending: Emergency Medicine | Admitting: Emergency Medicine

## 2022-10-03 DIAGNOSIS — R350 Frequency of micturition: Secondary | ICD-10-CM | POA: Diagnosis present

## 2022-10-03 DIAGNOSIS — R9431 Abnormal electrocardiogram [ECG] [EKG]: Secondary | ICD-10-CM | POA: Diagnosis not present

## 2022-10-03 DIAGNOSIS — R410 Disorientation, unspecified: Secondary | ICD-10-CM | POA: Insufficient documentation

## 2022-10-03 DIAGNOSIS — N3000 Acute cystitis without hematuria: Secondary | ICD-10-CM | POA: Diagnosis not present

## 2022-10-03 LAB — CBC WITH DIFFERENTIAL/PLATELET
Abs Immature Granulocytes: 0.03 10*3/uL (ref 0.00–0.07)
Basophils Absolute: 0 10*3/uL (ref 0.0–0.1)
Basophils Relative: 1 %
Eosinophils Absolute: 0 10*3/uL (ref 0.0–0.5)
Eosinophils Relative: 0 %
HCT: 39.6 % (ref 39.0–52.0)
Hemoglobin: 13.6 g/dL (ref 13.0–17.0)
Immature Granulocytes: 0 %
Lymphocytes Relative: 15 %
Lymphs Abs: 1.3 10*3/uL (ref 0.7–4.0)
MCH: 34.1 pg — ABNORMAL HIGH (ref 26.0–34.0)
MCHC: 34.3 g/dL (ref 30.0–36.0)
MCV: 99.2 fL (ref 80.0–100.0)
Monocytes Absolute: 0.9 10*3/uL (ref 0.1–1.0)
Monocytes Relative: 11 %
Neutro Abs: 6.3 10*3/uL (ref 1.7–7.7)
Neutrophils Relative %: 73 %
Platelets: 185 10*3/uL (ref 150–400)
RBC: 3.99 MIL/uL — ABNORMAL LOW (ref 4.22–5.81)
RDW: 12.7 % (ref 11.5–15.5)
WBC: 8.6 10*3/uL (ref 4.0–10.5)
nRBC: 0 % (ref 0.0–0.2)

## 2022-10-03 LAB — URINALYSIS, ROUTINE W REFLEX MICROSCOPIC
Bilirubin Urine: NEGATIVE
Glucose, UA: NEGATIVE mg/dL
Hgb urine dipstick: NEGATIVE
Ketones, ur: NEGATIVE mg/dL
Nitrite: NEGATIVE
Protein, ur: NEGATIVE mg/dL
Specific Gravity, Urine: <1.005 — ABNORMAL LOW (ref 1.005–1.030)
pH: 6 (ref 5.0–8.0)

## 2022-10-03 LAB — BASIC METABOLIC PANEL
Anion gap: 7 (ref 5–15)
BUN: 13 mg/dL (ref 8–23)
CO2: 24 mmol/L (ref 22–32)
Calcium: 8.3 mg/dL — ABNORMAL LOW (ref 8.9–10.3)
Chloride: 102 mmol/L (ref 98–111)
Creatinine, Ser: 0.82 mg/dL (ref 0.61–1.24)
GFR, Estimated: >60 mL/min (ref >60)
Glucose, Bld: 108 mg/dL — ABNORMAL HIGH (ref 70–99)
Potassium: 3.7 mmol/L (ref 3.5–5.1)
Sodium: 133 mmol/L — ABNORMAL LOW (ref 135–145)

## 2022-10-03 LAB — URINALYSIS, MICROSCOPIC (REFLEX)

## 2022-10-03 MED ORDER — CEPHALEXIN 500 MG PO CAPS
500.0000 mg | ORAL_CAPSULE | Freq: Once | ORAL | Status: AC
  Administered 2022-10-03: 500 mg via ORAL
  Filled 2022-10-03: qty 1

## 2022-10-03 MED ORDER — CEPHALEXIN 500 MG PO CAPS: 500.0000 mg | ORAL_CAPSULE | Freq: Three times a day (TID) | ORAL | 0 refills | Status: DC

## 2022-10-03 MED ORDER — LACTATED RINGERS IV BOLUS
500.0000 mL | Freq: Once | INTRAVENOUS | Status: AC
  Administered 2022-10-03: 500 mL via INTRAVENOUS

## 2022-10-03 NOTE — Discharge Instructions (Signed)
We saw you in the emergency room for urinary frequency and some confusion.  Your lab workup is reassuring.  Blood pressure has been stable in the ER.  We recommend that you take the antibiotics that are prescribed to cover for a bladder infection.  Return to the emergency room if you start having one-sided weakness, numbness, slurred speech, balance issues, seizure-like activity, profound confusion, vision loss.

## 2022-10-03 NOTE — ED Provider Notes (Signed)
[place] EMERGENCY DEPARTMENT AT [facility] Provider Note   CSN: [id number] Arrival date & time: 10/03/22  1501     History  Chief Complaint  Patient presents with   Hypotension   Urinary Frequency    Steve Hawkins is a 82 y.o. male.  HPI    82 year old male comes in with chief complaint of low blood pressure, urinary frequency.  Patient states that he had gone to see his PCP this morning because he has been feeling a little bit confused over the last couple of days.  He was also concerned about urinary frequency that he noticed yesterday.  He went to the bathroom 4 times last night which is unusual and on all of those occasions he barely had any urine output.  He denies any pain with urination, blood in the urine.  Patient describes confusion as just not being as sharp and also on 1 occasion putting bread in the microwave inside of toaster.  At the PCP, patient was found to have low blood pressure and they sent him to the emergency room.  Patient denies any chest pain, shortness of breath, dizziness, headaches, one-sided weakness, numbness, slurred speech.    Home Medications Prior to Admission medications   Medication Sig Start Date End Date Taking? Authorizing Provider  finasteride (PROSCAR) 5 MG tablet TAKE 1 TABLET (5 MG TOTAL) BY MOUTH DAILY. 05/16/22   Marcine Matar, MD  Multiple Vitamins-Minerals (PRESERVISION AREDS 2) CAPS Take 1 capsule by mouth in the morning and at bedtime. 03/07/22   [provider]  sildenafil (VIAGRA) 100 MG tablet 1/2 to 1 tablet p.o. as needed Patient taking differently: Take 50-100 mg by mouth as needed for erectile dysfunction. 08/15/22   Marcine Matar, MD  tamsulosin (FLOMAX) 0.4 MG CAPS capsule TAKE 1 CAPSULE BY MOUTH EVERY DAY Patient taking differently: Take 0.4 mg by mouth daily. 05/16/22   Marcine Matar, MD      Allergies    Bee venom    Review of Systems   Review of Systems  All other systems  reviewed and are negative.   Physical Exam Updated Vital Signs BP 133/75   Pulse 67   Temp 97.8 F (36.6 C) (Oral)   Resp 20   Ht 5' 8.5" (1.74 m)   Wt 65.8 kg   SpO2 96%   BMI 21.73 kg/m  Physical Exam Vitals and nursing note reviewed.  Constitutional:      Appearance: He is well-developed.  HENT:     Head: Atraumatic.  Eyes:     Extraocular Movements: Extraocular movements intact.     Pupils: Pupils are equal, round, and reactive to light.     Comments: No nystagmus  Cardiovascular:     Rate and Rhythm: Normal rate.  Pulmonary:     Effort: Pulmonary effort is normal.  Musculoskeletal:     Cervical back: Neck supple.  Skin:    General: Skin is warm.  Neurological:     Mental Status: He is alert and oriented to person, place, and time. Mental status is at baseline.     Cranial Nerves: No cranial nerve deficit.     Sensory: No sensory deficit.     Motor: No weakness.     Coordination: Coordination normal.     ED Results / Procedures / Treatments   Labs (all labs ordered are listed, but only abnormal results are displayed) Labs Reviewed  URINALYSIS, ROUTINE W REFLEX MICROSCOPIC - Abnormal; Notable for the following components:  Result Value   Specific Gravity, Urine <1.005 (*)    Leukocytes,Ua TRACE (*)    All other components within normal limits  BASIC METABOLIC PANEL - Abnormal; Notable for the following components:   Sodium 133 (*)    Glucose, Bld 108 (*)    Calcium 8.3 (*)    All other components within normal limits  CBC WITH DIFFERENTIAL/PLATELET - Abnormal; Notable for the following components:   RBC 3.99 (*)    MCH 34.1 (*)    All other components within normal limits  URINALYSIS, MICROSCOPIC (REFLEX) - Abnormal; Notable for the following components:   Bacteria, UA RARE (*)    All other components within normal limits  URINE CULTURE    EKG EKG Interpretation Date/Time:  Tuesday October 03 2022 15:16:42 EDT Ventricular Rate:  69 PR  Interval:  162 QRS Duration:  84 QT Interval:  360 QTC Calculation: 385 R Axis:   -49  Text Interpretation: Normal sinus rhythm Left axis deviation Low voltage QRS Septal infarct , age undetermined Abnormal ECG When compared with ECG of 06-Nov-2003 12:03, QRS axis Shifted left Confirmed by Derwood Kaplan [phone]) on 10/03/2022 3:35:37 PM  Radiology No results found.  Procedures Procedures    Medications Ordered in ED Medications  cephALEXin (KEFLEX) capsule 500 mg (has no administration in time range)  lactated ringers bolus 500 mL (0 mLs Intravenous Stopped 10/03/22 1614)    ED Course/ Medical Decision Making/ A&P                                 Medical Decision Making Amount and/or Complexity of Data Reviewed Labs: ordered.  Risk Prescription drug management.   This patient presents to the ED with chief complaint(s) of urinary frequency, confusion and low BP with pertinent past medical history of enlarged prostate.The complaint involves an extensive differential diagnosis and also carries with it a high risk of complications and morbidity.    The differential diagnosis includes : Hypotension due to UTI, dehydration and resultant electrolyte abnormality, renal failure.  Orthostatic hypotension because of medication side effects, UTI.  Patient is AOx3, no focal neurodeficits.  His blood pressure is normal in the ER.  He denies any volume loss.  The initial plan is to get basic labs and give patient some IV fluid. UA with cultures also sent.  Additional history obtained: Records reviewed  reviewed previous cultures, labs and also medications.  Independent labs interpretation:  The following labs were independently interpreted: CBC and BMP are normal.  UA is leukocyte positive.  No evidence of ketones.  No evidence of profound dehydration.  Clinically not in shock.  Patient's blood pressure in the ER has been constantly normal and there has not been any tachycardia.  No  fevers.  White count is also normal. Treatment and Reassessment: Patient reassessed.  Results of ER workup discussed with him and his daughter who is at the bedside. Informed him that with urinary frequency and questionable UTI markers we will clinically treat him like a UTI.  Advised that he takes precautions with ADLs.  Wife lives with him and can check on him according to the daughter.  Daughter will also check on him.  He will come back to the ER if there is any strokelike symptoms.   Final Clinical Impression(s) / ED Diagnoses Final diagnoses:  Acute cystitis without hematuria  Intermittent confusion    Rx / DC Orders ED Discharge Orders  None         Derwood Kaplan, MD 10/03/22 [phone]

## 2022-10-03 NOTE — ED Notes (Signed)
Pt is upset and requests to go home Informed MD

## 2022-10-03 NOTE — Telephone Encounter (Signed)
This was to schedule CT and patient is aware.

## 2022-10-03 NOTE — ED Notes (Signed)
Introduced self to pt Pt stated he went to PCP today for frequent urination and confusion. Pt stated that this morning we went to make toast, instead of using the toaster he placed the bread in the microwave  Denies CP or SOB Complains of intermittent HA   EKG completed in triage Pt attached to partial monitor  Urinal at bedside

## 2022-10-03 NOTE — ED Triage Notes (Signed)
Pt sent from Dr. Alonza Smoker office for evaluation of low B/P, blood pressure 112/62 in triage.

## 2022-10-10 ENCOUNTER — Telehealth: Payer: Self-pay

## 2022-10-10 NOTE — Telephone Encounter (Signed)
Transition Care Management Follow-up Telephone Call Date of discharge and from where: [facility] 8/6 How have you been since you were released from the hospital? Doing fine a little soreness in his leg Any questions or concerns? No  Items Reviewed: Did the pt receive and understand the discharge instructions provided? Yes  Medications obtained and verified? No  Other? No  Any new allergies since your discharge? No  Dietary orders reviewed? No Do you have support at home? Yes     Follow up appointments reviewed:  PCP Hospital f/u appt confirmed? Yes  Scheduled to see  on  @ . Specialist Hospital f/u appt confirmed? No  Scheduled to see  on  @ . Are transportation arrangements needed? No  If their condition worsens, is the pt aware to call PCP or go to the Emergency Dept.? Yes Was the patient provided with contact information for the PCP's office or ED? Yes Was to pt encouraged to call back with questions or concerns? Yes

## 2022-10-19 DIAGNOSIS — Z08 Encounter for follow-up examination after completed treatment for malignant neoplasm: Secondary | ICD-10-CM | POA: Diagnosis not present

## 2022-10-19 DIAGNOSIS — Z1283 Encounter for screening for malignant neoplasm of skin: Secondary | ICD-10-CM | POA: Diagnosis not present

## 2022-10-19 DIAGNOSIS — L820 Inflamed seborrheic keratosis: Secondary | ICD-10-CM | POA: Diagnosis not present

## 2022-10-19 DIAGNOSIS — L858 Other specified epidermal thickening: Secondary | ICD-10-CM | POA: Diagnosis not present

## 2022-10-19 DIAGNOSIS — Z85820 Personal history of malignant melanoma of skin: Secondary | ICD-10-CM | POA: Diagnosis not present

## 2022-10-22 NOTE — Progress Notes (Unsigned)
GI Office Note    Referring Provider: Carylon Perches, MD Primary Care Physician:  Carylon Perches, MD  Primary Gastroenterologist:  Chief Complaint   No chief complaint on file.    History of Present Illness   Steve Hawkins is a 82 y.o. male presenting today     CT chest 08/2022: IMPRESSION: 1. There is a 6.7 cm hypoenhancing consolidative nodular opacity at the LEFT lung base. This likely reflects an aspiration pneumonia given upstream endobronchial thickening and plugging but malignancy is in the differential. Recommend follow-up CT in 3 months after appropriate treatment to assess for resolution. 2. There are multiple prominent mediastinal and LEFT hilar lymph nodes which are indeterminate. Recommend attention on follow-up 3. There are multiple subtle areas of enhancement of the liver. These are indeterminate. Recommend further evaluation with dedicated outpatient abdominal MRI with and without contrast when clinically appropriate.  Colonoscopy 03/2019: -perianal skin tags found -one small polyp at appendiceal orific s/p bx, hyperplastic -six small polyps sigmoid and ascending colon, 3 were tubular adenomas, 3 sessile serrate polyps -diverticulosis -external hemorrhoids -consider 5 year surveillance if health permits   Medications   Current Outpatient Medications  Medication Sig Dispense Refill   cephALEXin (KEFLEX) 500 MG capsule Take 1 capsule (500 mg total) by mouth 3 (three) times daily. 20 capsule 0   finasteride (PROSCAR) 5 MG tablet TAKE 1 TABLET (5 MG TOTAL) BY MOUTH DAILY. 90 tablet 3   Multiple Vitamins-Minerals (PRESERVISION AREDS 2) CAPS Take 1 capsule by mouth in the morning and at bedtime.     sildenafil (VIAGRA) 100 MG tablet 1/2 to 1 tablet p.o. as needed (Patient taking differently: Take 50-100 mg by mouth as needed for erectile dysfunction.) 30 tablet 11   tamsulosin (FLOMAX) 0.4 MG CAPS capsule TAKE 1 CAPSULE BY MOUTH EVERY DAY (Patient taking  differently: Take 0.4 mg by mouth daily.) 90 capsule 3   No current facility-administered medications for this visit.    Allergies   Allergies as of 10/23/2022 - Review Complete 10/03/2022  Allergen Reaction Noted   Bee venom Anaphylaxis 09/10/2013    Past Medical History   Past Medical History:  Diagnosis Date   Cancer (HCC) 12/2018   skin cancer-melanoma   Varicose veins     Past Surgical History   Past Surgical History:  Procedure Laterality Date   CHOLECYSTECTOMY     COLONOSCOPY N/A 04/23/2019   Procedure: COLONOSCOPY;  Surgeon: Malissa Hippo, MD;  Location: [facility];  Service: Endoscopy;  Laterality: N/A;  930   ENDOVENOUS ABLATION SAPHENOUS VEIN W/ LASER     ENDOVENOUS ABLATION SAPHENOUS VEIN W/ LASER Left 12-21-2014   endovenous laser ablation left small saphenous vein  by Dr. Josephina Gip    HEMORRHOID SURGERY     POLYPECTOMY  04/23/2019   Procedure: POLYPECTOMY;  Surgeon: Malissa Hippo, MD;  Location: [facility];  Service: Endoscopy;;    Past Family History   Family History  Problem Relation Age of Onset   Colon cancer Maternal Aunt     Past Social History   Social History   Socioeconomic History   Marital status: Married    Spouse name: Not on file   Number of children: Not on file   Years of education: Not on file   Highest education level: Not on file  Occupational History   Not on file  Tobacco Use   Smoking status: Former   Smokeless tobacco: Never  Vaping Use  Vaping status: Never Used  Substance and Sexual Activity   Alcohol use: Yes    Alcohol/week: 4.0 standard drinks of alcohol    Types: 1 Cans of beer, 1 Shots of liquor, 2 Standard drinks or equivalent per week   Drug use: No   Sexual activity: Not on file  Other Topics Concern   Not on file  Social History Narrative   Not on file   Social Determinants of Health   Financial Resource Strain: Not on file  Food Insecurity: Not on file  Transportation Needs: Not  on file  Physical Activity: Not on file  Stress: Not on file  Social Connections: Not on file  Intimate Partner Violence: Not on file    Review of Systems   General: Negative for anorexia, weight loss, fever, chills, fatigue, weakness. Eyes: Negative for vision changes.  ENT: Negative for hoarseness, difficulty swallowing , nasal congestion. CV: Negative for chest pain, angina, palpitations, dyspnea on exertion, peripheral edema.  Respiratory: Negative for dyspnea at rest, dyspnea on exertion, cough, sputum, wheezing.  GI: See history of present illness. GU:  Negative for dysuria, hematuria, urinary incontinence, urinary frequency, nocturnal urination.  MS: Negative for joint pain, low back pain.  Derm: Negative for rash or itching.  Neuro: Negative for weakness, abnormal sensation, seizure, frequent headaches, memory loss,  confusion.  Psych: Negative for anxiety, depression, suicidal ideation, hallucinations.  Endo: Negative for unusual weight change.  Heme: Negative for bruising or bleeding. Allergy: Negative for rash or hives.  Physical Exam   There were no vitals taken for this visit.   General: Well-nourished, well-developed in no acute distress.  Head: Normocephalic, atraumatic.   Eyes: Conjunctiva pink, no icterus. Mouth: Oropharyngeal mucosa moist and pink , no lesions erythema or exudate. Neck: Supple without thyromegaly, masses, or lymphadenopathy.  Lungs: Clear to auscultation bilaterally.  Heart: Regular rate and rhythm, no murmurs rubs or gallops.  Abdomen: Bowel sounds are normal, nontender, nondistended, no hepatosplenomegaly or masses,  no abdominal bruits or hernia, no rebound or guarding.   Rectal: *** Extremities: No lower extremity edema. No clubbing or deformities.  Neuro: Alert and oriented x 4 , grossly normal neurologically.  Skin: Warm and dry, no rash or jaundice.   Psych: Alert and cooperative, normal mood and affect.  Labs   Lab Results   Component Value Date   WBC 8.6 10/03/2022   HGB 13.6 10/03/2022   HCT 39.6 10/03/2022   MCV 99.2 10/03/2022   PLT 185 10/03/2022   Lab Results  Component Value Date   NA 133 (L) 10/03/2022   CL 102 10/03/2022   K 3.7 10/03/2022   CO2 24 10/03/2022   BUN 13 10/03/2022   CREATININE 0.82 10/03/2022   GFRNONAA >60 10/03/2022   CALCIUM 8.3 (L) 10/03/2022   ALBUMIN 2.9 (L) 09/07/2022   GLUCOSE 108 (H) 10/03/2022   Lab Results  Component Value Date   ALT 13 09/07/2022   AST 14 (L) 09/07/2022   ALKPHOS 64 09/07/2022   BILITOT 1.3 (H) 09/07/2022    Imaging Studies   No results found.  Assessment       PLAN   ***   Leanna Battles. Melvyn Neth, MHS, PA-C [facility]

## 2022-10-22 NOTE — H&P (View-Only) (Signed)
GI Office Note    Referring Provider: Carylon Perches, MD Primary Care Physician:  Carylon Perches, MD  Primary Gastroenterologist: Dr. Levon Hedger  Chief Complaint   Chief Complaint  Patient presents with   Dysphagia    Here to discuss possible EGD w/dilation     History of Present Illness   Steve Hawkins is a 82 y.o. male presenting today for follow up of dysphagia.   Seen in the ED September 07, 2022 for evaluation of syncopal episode.  Had been to urgent care the day before empirically started on doxycycline for suspected bronchitis.  Patient developed episode of vomiting after taking doxycycline, associated with syncopal event after using the restroom, falling to the ground striking his head on the ground.  While in the ED he had reported several month history of difficulty swallowing.  CT head showed no acute intracranial pathology.  He was noted to have debris in the trachea.  CT chest completed to further evaluate, this showed large 6.7 cm consolidative nodular opacity in the left lung base likely aspiration pneumonia given upstream endobronchial thickening and plugging but malignancy is in differential.  There were also multiple prominent mediastinal and left hilar lymph nodes with attention to follow-up recommended.  Multiple subtle areas of enhancement of the liver also indeterminant, recommending dedicated outpatient abdominal MRI with and without contrast clinically appropriate.  Patient was given single dose of ceftriaxone and azithromycin while in the ED.  He was discharged on Augmentin.  Subsequently seen pulmonologist, Dr. Levy Pupa.  Plans for repeat CT scan of the chest at 6 to 8 weeks from treatment of recent aspiration pneumonia.  Dr. Annetta Maw was also planning for speech therapy evaluation for possible aspiration.  Today: patient states he has hiccups frequently when he is eating solid food. During this time he is unable to continue drinking. He tries breath holding to get  relief. He notes large pills get hung in the throat if he does not swallow them in the perfect direction. He has to swallow multiple times to get it to go down.  No odynophagia.  Denies abdominal pain.  Bowel movements regular, typically [place] 3-4.  Has chronic intermittent bright red blood per rectum, toilet tissue hematochezia sometimes in the stool.  He has had this off and on for years, really no significant change.       Wt Readings from Last 10 Encounters:  10/23/22 148 lb 6.4 oz (67.3 kg)  10/03/22 145 lb (65.8 kg)  09/26/22 145 lb (65.8 kg)  09/07/22 137 lb (62.1 kg)  11/15/21 146 lb 1.6 oz (66.3 kg)  10/31/21 145 lb 8.1 oz (66 kg)  10/29/21 147 lb (66.7 kg)  05/04/20 156 lb (70.8 kg)  04/08/20 155 lb (70.3 kg)  02/03/20 155 lb (70.3 kg)    CT chest 08/2022: IMPRESSION: 1. There is a 6.7 cm hypoenhancing consolidative nodular opacity at the LEFT lung base. This likely reflects an aspiration pneumonia given upstream endobronchial thickening and plugging but malignancy is in the differential. Recommend follow-up CT in 3 months after appropriate treatment to assess for resolution. 2. There are multiple prominent mediastinal and LEFT hilar lymph nodes which are indeterminate. Recommend attention on follow-up 3. There are multiple subtle areas of enhancement of the liver. These are indeterminate. Recommend further evaluation with dedicated outpatient abdominal MRI with and without contrast when clinically appropriate.  Colonoscopy 03/2019: -perianal skin tags found -one small polyp at appendiceal orific s/p bx, hyperplastic -six small polyps sigmoid  and ascending colon, 3 were tubular adenomas, 3 sessile serrate polyps -diverticulosis -external hemorrhoids -consider 5 year surveillance if health permits   Medications   Current Outpatient Medications  Medication Sig Dispense Refill   augmented betamethasone dipropionate (DIPROLENE-AF) 0.05 % ointment Apply topically 2  (two) times daily.     finasteride (PROSCAR) 5 MG tablet TAKE 1 TABLET (5 MG TOTAL) BY MOUTH DAILY. 90 tablet 3   Multiple Vitamins-Minerals (PRESERVISION AREDS 2) CAPS Take 1 capsule by mouth in the morning and at bedtime.     sildenafil (VIAGRA) 100 MG tablet 1/2 to 1 tablet p.o. as needed (Patient taking differently: Take 50-100 mg by mouth as needed for erectile dysfunction.) 30 tablet 11   tamsulosin (FLOMAX) 0.4 MG CAPS capsule TAKE 1 CAPSULE BY MOUTH EVERY DAY (Patient taking differently: Take 0.4 mg by mouth daily.) 90 capsule 3   No current facility-administered medications for this visit.    Allergies   Allergies as of 10/23/2022 - Review Complete 10/23/2022  Allergen Reaction Noted   Bee venom Anaphylaxis 09/10/2013    Past Medical History   Past Medical History:  Diagnosis Date   Cancer (HCC) 12/2018   skin cancer-melanoma   Macular degeneration    Varicose veins     Past Surgical History   Past Surgical History:  Procedure Laterality Date   CHOLECYSTECTOMY     COLONOSCOPY N/A 04/23/2019   Procedure: COLONOSCOPY;  Surgeon: Malissa Hippo, MD;  Location: [facility];  Service: Endoscopy;  Laterality: N/A;  930   ENDOVENOUS ABLATION SAPHENOUS VEIN W/ LASER     ENDOVENOUS ABLATION SAPHENOUS VEIN W/ LASER Left 12-21-2014   endovenous laser ablation left small saphenous vein  by Dr. Josephina Gip    HEMORRHOID SURGERY     POLYPECTOMY  04/23/2019   Procedure: POLYPECTOMY;  Surgeon: Malissa Hippo, MD;  Location: [facility];  Service: Endoscopy;;    Past Family History   Family History  Problem Relation Age of Onset   Colon cancer Maternal Aunt     Past Social History   Social History   Socioeconomic History   Marital status: Married    Spouse name: Not on file   Number of children: Not on file   Years of education: Not on file   Highest education level: Not on file  Occupational History   Not on file  Tobacco Use   Smoking status: Former    Smokeless tobacco: Never  Vaping Use   Vaping status: Never Used  Substance and Sexual Activity   Alcohol use: Yes    Alcohol/week: 4.0 standard drinks of alcohol    Types: 1 Cans of beer, 1 Shots of liquor, 2 Standard drinks or equivalent per week   Drug use: No   Sexual activity: Not on file  Other Topics Concern   Not on file  Social History Narrative   Not on file   Social Determinants of Health   Financial Resource Strain: Not on file  Food Insecurity: Not on file  Transportation Needs: Not on file  Physical Activity: Not on file  Stress: Not on file  Social Connections: Not on file  Intimate Partner Violence: Not on file    Review of Systems   General: Negative for anorexia, weight loss, fever, chills, fatigue, weakness. Eyes: Negative for vision changes.  ENT: Negative for hoarseness,  nasal congestion. See hpi CV: Negative for chest pain, angina, palpitations, dyspnea on exertion, peripheral edema.  Respiratory: Negative for dyspnea  at rest, dyspnea on exertion, cough, sputum, wheezing.  GI: See history of present illness. GU:  Negative for dysuria, hematuria, urinary incontinence, urinary frequency, nocturnal urination.  MS: Negative for joint pain, low back pain.  Derm: Negative for rash or itching.  Neuro: Negative for weakness, abnormal sensation, seizure, frequent headaches, memory loss,  confusion.  Psych: Negative for anxiety, depression, suicidal ideation, hallucinations.  Endo: Negative for unusual weight change.  Heme: Negative for bruising or bleeding. Allergy: Negative for rash or hives.  Physical Exam   BP 112/64 (BP Location: Right Arm, Patient Position: Sitting, Cuff Size: Normal)   Pulse 81   Temp 98.4 F (36.9 C) (Oral)   Ht 5\' 8"  (1.727 m)   Wt 148 lb 6.4 oz (67.3 kg)   SpO2 95%   BMI 22.56 kg/m    General: Well-nourished, well-developed in no acute distress.  Head: Normocephalic, atraumatic.   Eyes: Conjunctiva pink, no  icterus. Mouth: Oropharyngeal mucosa moist and pink  Neck: Supple without thyromegaly, masses, or lymphadenopathy.  Lungs: Clear to auscultation bilaterally.  Heart: Regular rate and rhythm, no murmurs rubs or gallops.  Abdomen: Bowel sounds are normal, nontender, nondistended, no hepatosplenomegaly or masses,  no abdominal bruits or hernia, no rebound or guarding.   Rectal: not performed Extremities: No lower extremity edema. No clubbing or deformities.  Neuro: Alert and oriented x 4 , grossly normal neurologically.  Skin: Warm and dry, no rash or jaundice.   Psych: Alert and cooperative, normal mood and affect.  Labs   Lab Results  Component Value Date   WBC 8.6 10/03/2022   HGB 13.6 10/03/2022   HCT 39.6 10/03/2022   MCV 99.2 10/03/2022   PLT 185 10/03/2022   Lab Results  Component Value Date   NA 133 (L) 10/03/2022   CL 102 10/03/2022   K 3.7 10/03/2022   CO2 24 10/03/2022   BUN 13 10/03/2022   CREATININE 0.82 10/03/2022   GFRNONAA >60 10/03/2022   CALCIUM 8.3 (L) 10/03/2022   ALBUMIN 2.9 (L) 09/07/2022   GLUCOSE 108 (H) 10/03/2022   Lab Results  Component Value Date   ALT 13 09/07/2022   AST 14 (L) 09/07/2022   ALKPHOS 64 09/07/2022   BILITOT 1.3 (H) 09/07/2022    Imaging Studies   No results found.  Assessment   *Dysphagia  Concern for possible aspiration pneumonia as outlined above.  Follow-up CT chest pending, schedule for August 30. Pulmonary ordered speech therapy swallowing evaluation one month ago but not scheduled. He may have element of esophageal dysphagia as well. Patient describes hiccups that occur while eating, significant enough that he has to stop eating until he passed.  Query symptom due to esophageal dysphagia/food impactions.    PLAN   EGD/ED with Dr. Levon Hedger. ASA 2.  I have discussed the risks, alternatives, benefits with regards to but not limited to the risk of reaction to medication, bleeding, infection, perforation and the  patient is agreeable to proceed. Written consent to be obtained. Modified barium swallow study with speech therapy due to history of aspiration pneumonia.   Leanna Battles. Melvyn Neth, MHS, PA-C [facility]  I have reviewed the note and agree with the APP's assessment as described in this progress note  Notably, the patient was found to have lesions on his liver of unclear etiology.  We will also schedule an MRI of the abdomen with and without IV contrast to further evaluate these abnormalities.  Katrinka Blazing, MD Gastroenterology and Hepatology [facility]  [facility]

## 2022-10-23 ENCOUNTER — Ambulatory Visit (INDEPENDENT_AMBULATORY_CARE_PROVIDER_SITE_OTHER): Payer: Medicare Other | Admitting: Gastroenterology

## 2022-10-23 ENCOUNTER — Telehealth: Payer: Self-pay | Admitting: Gastroenterology

## 2022-10-23 ENCOUNTER — Encounter: Payer: Self-pay | Admitting: Gastroenterology

## 2022-10-23 VITALS — BP 112/64 | HR 81 | Temp 98.4°F | Ht 68.0 in | Wt 148.4 lb

## 2022-10-23 DIAGNOSIS — R932 Abnormal findings on diagnostic imaging of liver and biliary tract: Secondary | ICD-10-CM

## 2022-10-23 DIAGNOSIS — R1310 Dysphagia, unspecified: Secondary | ICD-10-CM

## 2022-10-23 NOTE — Telephone Encounter (Signed)
Patient seen in the office today. I have discussed his case with Dr. Levon Hedger.   Please call patient: he needs EGD/ED with Dr. Levon Hedger (patient requested). ASA 2.   Dr. Levon Hedger is advising we going ahead and get MBSS for suspected aspiration PNA. Looks like it was ordered by Dr. Delton Coombes last month but not scheduled. Please arrange.

## 2022-10-23 NOTE — Patient Instructions (Signed)
It was a pleasure meeting you!  We will be in touch later today or tomorrow with further recommendations from Dr. Levon Hedger regarding possible endoscopic.

## 2022-10-23 NOTE — Telephone Encounter (Signed)
Pt was made aware and is ready to move forward with scheduling.  

## 2022-10-24 ENCOUNTER — Other Ambulatory Visit: Payer: Self-pay | Admitting: *Deleted

## 2022-10-24 ENCOUNTER — Encounter: Payer: Self-pay | Admitting: *Deleted

## 2022-10-24 DIAGNOSIS — Z8701 Personal history of pneumonia (recurrent): Secondary | ICD-10-CM

## 2022-10-24 DIAGNOSIS — R1310 Dysphagia, unspecified: Secondary | ICD-10-CM

## 2022-10-24 NOTE — Telephone Encounter (Signed)
Pt has been scheduled for 11/01/22. Instructions sent via MyChart. Referral sent to speech therapy for MBSS

## 2022-10-25 DIAGNOSIS — R932 Abnormal findings on diagnostic imaging of liver and biliary tract: Secondary | ICD-10-CM | POA: Insufficient documentation

## 2022-10-26 ENCOUNTER — Encounter (INDEPENDENT_AMBULATORY_CARE_PROVIDER_SITE_OTHER): Payer: Self-pay

## 2022-10-26 ENCOUNTER — Other Ambulatory Visit (INDEPENDENT_AMBULATORY_CARE_PROVIDER_SITE_OTHER): Payer: Self-pay | Admitting: Gastroenterology

## 2022-10-26 DIAGNOSIS — R932 Abnormal findings on diagnostic imaging of liver and biliary tract: Secondary | ICD-10-CM

## 2022-10-26 NOTE — Progress Notes (Signed)
MRI Abdomen and MRI Pelvis with Contrast ordered and scheduled. Pt is scheduled for 11/04/22 at [facility]. Pt is to arrive at 10:45am for 11:00 am appointment. NPO 4 hours prior. Pt has been contacted and made aware of appt. Will also send my chart reminder.

## 2022-10-27 ENCOUNTER — Ambulatory Visit (HOSPITAL_COMMUNITY)
Admission: RE | Admit: 2022-10-27 | Discharge: 2022-10-27 | Disposition: A | Discharge status: Home / Self Care | Payer: Medicare Other | Source: Ambulatory Visit | Attending: Emergency Medicine | Admitting: Emergency Medicine

## 2022-10-27 ENCOUNTER — Other Ambulatory Visit (HOSPITAL_COMMUNITY): Payer: Self-pay | Admitting: Occupational Therapy

## 2022-10-27 ENCOUNTER — Telehealth (INDEPENDENT_AMBULATORY_CARE_PROVIDER_SITE_OTHER): Payer: Self-pay

## 2022-10-27 ENCOUNTER — Encounter (HOSPITAL_COMMUNITY): Payer: Self-pay

## 2022-10-27 DIAGNOSIS — R059 Cough, unspecified: Secondary | ICD-10-CM

## 2022-10-27 DIAGNOSIS — T17328S Food in larynx causing other injury, sequela: Secondary | ICD-10-CM

## 2022-10-27 DIAGNOSIS — R1312 Dysphagia, oropharyngeal phase: Secondary | ICD-10-CM

## 2022-10-27 DIAGNOSIS — J189 Pneumonia, unspecified organism: Secondary | ICD-10-CM | POA: Insufficient documentation

## 2022-10-27 DIAGNOSIS — R1310 Dysphagia, unspecified: Secondary | ICD-10-CM | POA: Diagnosis not present

## 2022-10-27 DIAGNOSIS — I700 Atherosclerosis of aorta: Secondary | ICD-10-CM | POA: Diagnosis not present

## 2022-10-27 NOTE — Telephone Encounter (Signed)
I tried calling the patient back at both numbers provided. [phone] and Cell [phone]. I left a message to return call to the office and that we would be leaving today at 11:30 am.  Patient called today says he has an appointment for Egd on 11/01/2022 with Dr. Levon Hedger, and scheduled the following day for the Swallowing test. Patient wanted to know if he should postpone the EGD until the swallowing trest is completed.

## 2022-10-27 NOTE — Telephone Encounter (Signed)
Please explain to the patient that the esophagogastroduodenospy will help [place] evaluate for abnormalities in his esophagus that could cause issues with swallowing, the x-ray will only help to see abnormalities in his oropharynx/mouth.

## 2022-10-31 ENCOUNTER — Telehealth (INDEPENDENT_AMBULATORY_CARE_PROVIDER_SITE_OTHER): Payer: Self-pay | Admitting: *Deleted

## 2022-10-31 NOTE — Telephone Encounter (Signed)
Spoke with the patient wife Darl Pikes on [place] and explained to the patient that the esophagogastroduodenospy will help [place] evaluate for abnormalities in his esophagus that could cause issues with swallowing, the x-ray will only help to see abnormalities in his oropharynx/mouth. They state understanding and say the swallowing test has now been moved to September 18th.

## 2022-11-01 ENCOUNTER — Encounter (HOSPITAL_COMMUNITY): Payer: Self-pay | Admitting: Gastroenterology

## 2022-11-01 ENCOUNTER — Ambulatory Visit (HOSPITAL_BASED_OUTPATIENT_CLINIC_OR_DEPARTMENT_OTHER): Payer: Medicare Other | Admitting: Anesthesiology

## 2022-11-01 ENCOUNTER — Other Ambulatory Visit: Payer: Self-pay

## 2022-11-01 ENCOUNTER — Ambulatory Visit (HOSPITAL_COMMUNITY): Payer: Medicare Other | Admitting: Anesthesiology

## 2022-11-01 ENCOUNTER — Ambulatory Visit (HOSPITAL_COMMUNITY)
Admission: RE | Admit: 2022-11-01 | Discharge: 2022-11-01 | Disposition: A | Discharge status: Home / Self Care | Payer: Medicare Other | Attending: Gastroenterology | Admitting: Gastroenterology

## 2022-11-01 ENCOUNTER — Encounter (HOSPITAL_COMMUNITY)
Admission: RE | Disposition: A | Discharge status: Home / Self Care | Payer: Self-pay | Source: Home / Self Care | Attending: Gastroenterology

## 2022-11-01 DIAGNOSIS — R1310 Dysphagia, unspecified: Secondary | ICD-10-CM | POA: Insufficient documentation

## 2022-11-01 DIAGNOSIS — K222 Esophageal obstruction: Secondary | ICD-10-CM | POA: Diagnosis not present

## 2022-11-01 DIAGNOSIS — R066 Hiccough: Secondary | ICD-10-CM | POA: Diagnosis not present

## 2022-11-01 DIAGNOSIS — Z87891 Personal history of nicotine dependence: Secondary | ICD-10-CM | POA: Insufficient documentation

## 2022-11-01 DIAGNOSIS — K449 Diaphragmatic hernia without obstruction or gangrene: Secondary | ICD-10-CM | POA: Diagnosis not present

## 2022-11-01 HISTORY — PX: ESOPHAGOGASTRODUODENOSCOPY (EGD) WITH PROPOFOL: SHX5813

## 2022-11-01 HISTORY — PX: ESOPHAGEAL DILATION: SHX303

## 2022-11-01 SURGERY — ESOPHAGOGASTRODUODENOSCOPY (EGD) WITH PROPOFOL
Anesthesia: General

## 2022-11-01 MED ORDER — OMEPRAZOLE 40 MG PO CPDR: 40.0000 mg | DELAYED_RELEASE_CAPSULE | Freq: Every day | ORAL | 3 refills | Status: DC

## 2022-11-01 MED ORDER — PROPOFOL 10 MG/ML IV BOLUS
INTRAVENOUS | Status: DC | PRN
Start: 2022-11-01 — End: 2022-11-01
  Administered 2022-11-01: 100 mg via INTRAVENOUS

## 2022-11-01 MED ORDER — LACTATED RINGERS IV SOLN: INTRAVENOUS | Status: DC

## 2022-11-01 MED ORDER — PROPOFOL 500 MG/50ML IV EMUL
INTRAVENOUS | Status: DC | PRN
  Administered 2022-11-01: 200 mg via INTRAVENOUS

## 2022-11-01 MED ORDER — LIDOCAINE HCL (CARDIAC) PF 100 MG/5ML IV SOSY
PREFILLED_SYRINGE | INTRAVENOUS | Status: DC | PRN
  Administered 2022-11-01: 60 mg via INTRATRACHEAL

## 2022-11-01 MED ORDER — LACTATED RINGERS IV SOLN: INTRAVENOUS | Status: DC | PRN

## 2022-11-01 NOTE — Op Note (Signed)
[facility] Patient Name: Steve Hawkins Procedure Date: 11/01/2022 7:49 AM MRN: [id number] Date of Birth: 1941/02/17 Attending MD: Katrinka Blazing , , [id number] CSN: [id number] Age: 82 Admit Type: Outpatient Procedure:                Upper GI endoscopy Indications:              Dysphagia Providers:                Katrinka Blazing, Edrick Kins, RN, Zena Amos Referring MD:              Medicines:                Monitored Anesthesia Care Complications:            No immediate complications. Estimated Blood Loss:     Estimated blood loss: none. Procedure:                Pre-Anesthesia Assessment:                           - Prior to the procedure, a History and Physical                            was performed, and patient medications, allergies                            and sensitivities were reviewed. The patient's                            tolerance of previous anesthesia was reviewed.                           - The risks and benefits of the procedure and the                            sedation options and risks were discussed with the                            patient. All questions were answered and informed                            consent was obtained.                           - ASA Grade Assessment: II - A patient with mild                            systemic disease.                           After obtaining informed consent, the endoscope was                            passed under direct vision. Throughout the                            procedure, the patient's blood pressure, pulse,  and                            oxygen saturations were monitored continuously. The                            [id number]-H190 ([id number]) scope was introduced through the                            mouth, and advanced to the second part of duodenum.                            The upper GI endoscopy was accomplished without                            difficulty. The patient tolerated  the procedure                            well. Scope In: 8:00:58 AM Scope Out: 8:06:58 AM Total Procedure Duration: 0 hours 6 minutes 0 seconds  Findings:      A non-obstructing Schatzki ring was found at the gastroesophageal       junction. A cold forceps was used to disrupt the rim of the ring. A TTS       dilator was passed through the scope. Dilation with an 18-19-20 mm       balloon dilator was performed to 20 mm.      A 3 cm hiatal hernia was present.      The gastroesophageal flap valve was visualized endoscopically and       classified as Hill Grade IV (no fold, wide open lumen, hiatal hernia       present).      The stomach was normal.      The examined duodenum was normal. Impression:               - Non-obstructing Schatzki ring. Dilated.                           - 3 cm hiatal hernia.                           - Normal stomach.                           - Normal examined duodenum.                           - No specimens collected. Moderate Sedation:      Per Anesthesia Care Recommendation:           - Discharge patient to home (ambulatory).                           - Resume previous diet.                           - Use Prilosec (omeprazole) 40 mg PO daily.                           -  Proceed with scheduled modified barium swallow. Procedure Code(s):        --- Professional ---                           [phone], Esophagogastroduodenoscopy, flexible,                            transoral; with transendoscopic balloon dilation of                            esophagus (less than 30 mm diameter) Diagnosis Code(s):        --- Professional ---                           K22.2, Esophageal obstruction                           K44.9, Diaphragmatic hernia without obstruction or                            gangrene                           R13.10, Dysphagia, unspecified CPT copyright 2022 American Medical Association. All rights reserved. The codes documented in this report are  preliminary and upon coder review may  be revised to meet current compliance requirements. Katrinka Blazing, MD Katrinka Blazing,  11/01/2022 8:13:20 AM This report has been signed electronically. Number of Addenda: 0

## 2022-11-01 NOTE — Anesthesia Preprocedure Evaluation (Signed)
Anesthesia Evaluation  Patient identified by MRN, date of birth, ID band Patient awake    Reviewed: Allergy & Precautions, H&P , NPO status , Patient's Chart, lab work & pertinent test results  Airway Mallampati: II  TM Distance: >3 FB Neck ROM: Full    Dental  (+) Dental Advisory Given, Partial Upper   Pulmonary neg pulmonary ROS, former smoker   Pulmonary exam normal breath sounds clear to auscultation       Cardiovascular Exercise Tolerance: Good negative cardio ROS Normal cardiovascular exam Rhythm:Regular Rate:Normal     Neuro/Psych negative neurological ROS  negative psych ROS   GI/Hepatic negative GI ROS, Neg liver ROS,,,  Endo/Other  negative endocrine ROS    Renal/GU negative Renal ROS  negative genitourinary   Musculoskeletal negative musculoskeletal ROS (+)    Abdominal   Peds negative pediatric ROS (+)  Hematology negative hematology ROS (+)   Anesthesia Other Findings   Reproductive/Obstetrics negative OB ROS                             Anesthesia Physical Anesthesia Plan  ASA: 2  Anesthesia Plan: General   Post-op Pain Management: Minimal or no pain anticipated   Induction: Intravenous  PONV Risk Score and Plan: 0 and Propofol infusion  Airway Management Planned: Natural Airway and Nasal Cannula  Additional Equipment:   Intra-op Plan:   Post-operative Plan:   Informed Consent: I have reviewed the patients History and Physical, chart, labs and discussed the procedure including the risks, benefits and alternatives for the proposed anesthesia with the patient or authorized representative who has indicated his/her understanding and acceptance.     Dental advisory given  Plan Discussed with: CRNA and Surgeon  Anesthesia Plan Comments:        Anesthesia Quick Evaluation

## 2022-11-01 NOTE — Anesthesia Postprocedure Evaluation (Signed)
Anesthesia Post Note  Patient: Steve Hawkins  Procedure(s) Performed: ESOPHAGOGASTRODUODENOSCOPY (EGD) WITH PROPOFOL ESOPHAGEAL DILATION  Patient location during evaluation: Phase II Anesthesia Type: General Level of consciousness: awake and alert and oriented Pain management: pain level controlled Vital Signs Assessment: post-procedure vital signs reviewed and stable Respiratory status: spontaneous breathing, nonlabored ventilation and respiratory function stable Cardiovascular status: blood pressure returned to baseline and stable Postop Assessment: no apparent nausea or vomiting Anesthetic complications: no  No notable events documented.   Last Vitals:  Vitals:   11/01/22 0708 11/01/22 0811  BP: 126/68 135/74  Pulse: (!) 50 75  Resp: 14 20  Temp: 36.5 C (!) 36.4 C  SpO2: 97% 95%    Last Pain:  Vitals:   11/01/22 0811  TempSrc: Oral  PainSc: 0-No pain                 Shayanna Thatch C Klein [place]

## 2022-11-01 NOTE — Discharge Instructions (Signed)
You are being discharged to home.  Resume your previous diet.  Take Prilosec (omeprazole) 40 mg by mouth once a day.  Proceed with scheduled modified barium swallow.

## 2022-11-01 NOTE — Transfer of Care (Signed)
Immediate Anesthesia Transfer of Care Note  Patient: Steve Hawkins  Procedure(s) Performed: ESOPHAGOGASTRODUODENOSCOPY (EGD) WITH PROPOFOL ESOPHAGEAL DILATION  Patient Location: Endoscopy Unit  Anesthesia Type:General  Level of Consciousness: drowsy  Airway & Oxygen Therapy: Patient Spontanous Breathing  Post-op Assessment: Report given to RN and Post -op Vital signs reviewed and stable  Post vital signs: Reviewed and stable  Last Vitals:  Vitals Value Taken Time  BP 115/63   Temp 98   Pulse 68   Resp 16   SpO2 96     Last Pain:  Vitals:   11/01/22 0758  TempSrc:   PainSc: 0-No pain      Patients Stated Pain Goal: 9 (11/01/22 0708)  Complications: No notable events documented.

## 2022-11-01 NOTE — Interval H&P Note (Signed)
History and Physical Interval Note:  11/01/2022 7:36 AM  Steve Hawkins  has presented today for surgery, with the diagnosis of dysphagia.  The various methods of treatment have been discussed with the patient and family. After consideration of risks, benefits and other options for treatment, the patient has consented to  Procedure(s) with comments: ESOPHAGOGASTRODUODENOSCOPY (EGD) WITH PROPOFOL (N/A) - 8:00 am, asa 2 ESOPHAGEAL DILATION (N/A) as a surgical intervention.  The patient's history has been reviewed, patient examined, no change in status, stable for surgery.  I have reviewed the patient's chart and labs.  Questions were answered to the patient's satisfaction.     Katrinka Blazing [place]a

## 2022-11-02 ENCOUNTER — Encounter (HOSPITAL_COMMUNITY): Payer: Medicare Other | Admitting: Speech Pathology

## 2022-11-04 ENCOUNTER — Ambulatory Visit (HOSPITAL_COMMUNITY)
Admission: RE | Admit: 2022-11-04 | Discharge: 2022-11-04 | Disposition: A | Discharge status: Home / Self Care | Payer: Medicare Other | Source: Ambulatory Visit | Attending: Gastroenterology | Admitting: Gastroenterology

## 2022-11-04 DIAGNOSIS — N320 Bladder-neck obstruction: Secondary | ICD-10-CM | POA: Diagnosis not present

## 2022-11-04 DIAGNOSIS — K769 Liver disease, unspecified: Secondary | ICD-10-CM | POA: Diagnosis not present

## 2022-11-04 DIAGNOSIS — K5730 Diverticulosis of large intestine without perforation or abscess without bleeding: Secondary | ICD-10-CM | POA: Diagnosis not present

## 2022-11-04 DIAGNOSIS — D1803 Hemangioma of intra-abdominal structures: Secondary | ICD-10-CM | POA: Diagnosis not present

## 2022-11-04 DIAGNOSIS — R932 Abnormal findings on diagnostic imaging of liver and biliary tract: Secondary | ICD-10-CM | POA: Diagnosis not present

## 2022-11-04 MED ORDER — GADOBUTROL 1 MMOL/ML IV SOLN
6.0000 mL | Freq: Once | INTRAVENOUS | Status: AC | PRN
  Administered 2022-11-04: 6 mL via INTRAVENOUS

## 2022-11-08 ENCOUNTER — Encounter (HOSPITAL_COMMUNITY): Payer: Self-pay | Admitting: Gastroenterology

## 2022-11-09 NOTE — Telephone Encounter (Signed)
error 

## 2022-11-15 ENCOUNTER — Encounter (HOSPITAL_COMMUNITY): Payer: Self-pay | Admitting: Speech Pathology

## 2022-11-15 ENCOUNTER — Ambulatory Visit (HOSPITAL_COMMUNITY)
Admission: RE | Admit: 2022-11-15 | Discharge: 2022-11-15 | Disposition: A | Discharge status: Home / Self Care | Payer: Medicare Other | Source: Ambulatory Visit | Attending: Gastroenterology | Admitting: Gastroenterology

## 2022-11-15 ENCOUNTER — Ambulatory Visit (HOSPITAL_COMMUNITY): Payer: Medicare Other | Attending: Gastroenterology | Admitting: Speech Pathology

## 2022-11-15 DIAGNOSIS — R1312 Dysphagia, oropharyngeal phase: Secondary | ICD-10-CM | POA: Diagnosis not present

## 2022-11-15 DIAGNOSIS — R1310 Dysphagia, unspecified: Secondary | ICD-10-CM | POA: Diagnosis not present

## 2022-11-15 DIAGNOSIS — K219 Gastro-esophageal reflux disease without esophagitis: Secondary | ICD-10-CM | POA: Diagnosis not present

## 2022-11-15 DIAGNOSIS — R059 Cough, unspecified: Secondary | ICD-10-CM | POA: Insufficient documentation

## 2022-11-15 DIAGNOSIS — Z8701 Personal history of pneumonia (recurrent): Secondary | ICD-10-CM | POA: Insufficient documentation

## 2022-11-15 DIAGNOSIS — J189 Pneumonia, unspecified organism: Secondary | ICD-10-CM | POA: Diagnosis not present

## 2022-11-15 NOTE — Therapy (Signed)
[facility] at [facility] [address] [place], [place], [address] Phone: [phone]   Fax:  [phone]  Modified Barium Swallow  Patient Details  Name: Steve Hawkins MRN: [id number] Date of Birth: 1940/03/21 No data recorded  Encounter Date: 11/15/2022   End of Session - 11/15/22 1653     Visit Number 1    Number of Visits 1    Activity Tolerance Patient tolerated treatment well             HPI/PMH: HPI: Steve Hawkins is a 82 y.o. male with recent hospitalization for aspiration pneumonia. Pt further reports he has had PNA a total of three times this year. Pt reports frequently getting the hiccups while eating solid food. EGD with esophageal dilation completed on 11/01/22. MBSS ordered to objectively assess the swallowing function.   Clinical Impression: Clinical Impression: Pt presents with mild/moderate sensorimotor oropharyngeal dysphagia characterized by min to moderate amounts of diffuse pharyngeal residue (majority of residue in the valleculae and pyriforms) followed by occasional SILENT trace to min aspiration of residue after the swallow. Note Good laryngeal vestibule closure and airway protection before and during the swallow. Decreased base of tongue retraction, decreased pharyngeal stripping wave, and partial distention of pharyngoesophageal segment opening all contribute to pharyngeal residue. Visualized two episodes of delayed aspiration and despite cued strong cough it was not effective for clearing aspirates. Note min to moderate residue across all consistencies and textures therefore suspect some trace to min silent aspiration of all consistencies/textures. Effortful swallow with cues to "hold bolus and swallow hard" was most effective for minimizing pharyngeal residue followed by an additional dry repeat swallow and occasional throat clear. 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 Recommend continue with regular diet and thin liquids with effortful swallow followed by a dry repeat swallow and occasional throat clear throughout meals. Also reinforced the importance of frequent oral care (minimum TID). Recommend OP ST to reinforce strategies and consider EMST. Thank you for this referral,  Factors that may increase risk of adverse event in presence of aspiration Rubye Oaks & Clearance Coots 2021): No data recorded  Recommendations/Plan: Swallowing Evaluation Recommendations Swallowing Evaluation Recommendations Recommendations: PO diet PO Diet Recommendation: Regular; Thin liquids (Level 0) Liquid Administration via: Cup; Straw Medication Administration: Whole meds with liquid Supervision: Patient able to self-feed Swallowing strategies  : Slow rate; Small bites/sips Postural changes: Position pt fully upright for meals Oral care recommendations: Oral care BID (2x/day); Oral care QID (4x/day)    Treatment Plan Treatment Plan Treatment recommendations: Defer treatment plan to SLP at other venue (see follow-up recommendations) Follow-up recommendations: Outpatient SLP     Recommendations Recommendations for follow up therapy are one component of a multi-disciplinary discharge planning process, led by the attending physician.  Recommendations may be updated based on patient status, additional functional criteria and insurance authorization.  Assessment: Orofacial Exam: Orofacial Exam Oral Cavity: Oral Hygiene: WFL Oral Cavity - Dentition: Adequate natural dentition Orofacial Anatomy: WFL Oral Motor/Sensory Function: WFL    Anatomy:  Anatomy: WFL   Boluses Administered: Boluses Administered Boluses Administered: Thin liquids (Level 0); Mildly thick liquids (Level 2, nectar thick); Moderately thick liquids (Level 3, honey thick); Puree; Solid     Oral Impairment Domain: Oral Impairment  Domain Lip Closure: No labial escape Tongue control during bolus hold: Cohesive bolus between tongue to palatal seal Bolus preparation/mastication: Timely and efficient chewing and  mashing Bolus transport/lingual motion: Brisk tongue motion Oral residue: Complete oral clearance Location of oral residue : N/A     Pharyngeal Impairment Domain: Pharyngeal Impairment Domain Soft palate elevation: No bolus between soft palate (SP)/pharyngeal wall (PW) Laryngeal elevation: Partial superior movement of thyroid cartilage/partial approximation of arytenoids to epiglottic petiole Anterior hyoid excursion: Partial anterior movement Epiglottic movement: Partial inversion Laryngeal vestibule closure: Incomplete, narrow column air/contrast in laryngeal vestibule Pharyngeal stripping wave : Present - diminished Pharyngeal contraction (A/P view only): N/A Pharyngoesophageal segment opening: Partial distention/partial duration, partial obstruction of flow Tongue base retraction: Narrow column of contrast or air between tongue base and PPW Pharyngeal residue: Collection of residue within or on pharyngeal structures Location of pharyngeal residue: Pyriform sinuses; Diffuse (>3 areas); Aryepiglottic folds; Pharyngeal wall; Valleculae     Esophageal Impairment Domain: No data recorded  Pill: Pill Consistency administered: Thin liquids (Level 0); Puree Thin liquids (Level 0): Impaired (see clinical impressions) Puree: Impaired (see clinical impressions)    Penetration/Aspiration Scale Score: No data recorded  Compensatory Strategies: Compensatory Strategies Compensatory strategies: Yes Effortful swallow: Effective Chin tuck: Ineffective Left head turn: Ineffective Right head turn: Ineffective       General Information: Caregiver present: No   Diet Prior to this Study: Regular; Thin liquids (Level 0)    Temperature : Normal    Respiratory Status: WFL    Supplemental O2: None  (Room air)    History of Recent Intubation: No   Behavior/Cognition: Alert  Self-Feeding Abilities: Able to self-feed  Baseline vocal quality/speech: Normal  Volitional Cough: Able to elicit  Volitional Swallow: Able to elicit  No data recorded  Goal Planning: No data recorded No data recorded No data recorded No data recorded No data recorded  Pain: No data recorded  End of Session: Start Time:No data recorded Stop Time: No data recorded Time Calculation:No data recorded Charges: No data recorded SLP visit diagnosis: SLP Visit Diagnosis: Dysphagia, oropharyngeal phase (R13.12)    Past Medical History:  Past Medical History:  Diagnosis Date   Cancer (HCC) 12/2018   skin cancer-melanoma   Macular degeneration    Varicose veins    Past Surgical History:  Past Surgical History:  Procedure Laterality Date   CHOLECYSTECTOMY     COLONOSCOPY N/A 04/23/2019   Procedure: COLONOSCOPY;  Surgeon: Malissa Hippo, MD;  [facility];  Service: Endoscopy;  Laterality: N/A;  930   ENDOVENOUS ABLATION SAPHENOUS VEIN W/ LASER     ENDOVENOUS ABLATION SAPHENOUS VEIN W/ LASER Left 12-21-2014   endovenous laser ablation left small saphenous vein  by Dr. Josephina Gip    ESOPHAGEAL DILATION N/A 11/01/2022   Procedure: ESOPHAGEAL DILATION;  Surgeon: Dolores Frame, MD;  [facility]: [facility];  Service: Gastroenterology;  Laterality: N/A;   ESOPHAGOGASTRODUODENOSCOPY (EGD) WITH PROPOFOL N/A 11/01/2022   Procedure: ESOPHAGOGASTRODUODENOSCOPY (EGD) WITH PROPOFOL;  Surgeon: Dolores Frame, MD;  [facility];  Service: Gastroenterology;  Laterality: N/A;  8:00 am, asa 2   HEMORRHOID SURGERY     POLYPECTOMY  04/23/2019   Procedure: POLYPECTOMY;  Surgeon: Malissa Hippo, MD;  [facility];  Service: Endoscopy;;    Rayann Heman. Romie Levee, CCC-SLP Speech Language Pathologist   Georgetta Haber 11/15/2022, 4:54 PM

## 2022-12-25 ENCOUNTER — Other Ambulatory Visit: Payer: Self-pay | Admitting: *Deleted

## 2022-12-25 DIAGNOSIS — Z8701 Personal history of pneumonia (recurrent): Secondary | ICD-10-CM

## 2022-12-25 DIAGNOSIS — R1310 Dysphagia, unspecified: Secondary | ICD-10-CM

## 2022-12-27 ENCOUNTER — Ambulatory Visit (HOSPITAL_COMMUNITY): Payer: Medicare Other | Attending: Gastroenterology | Admitting: Speech Pathology

## 2022-12-27 DIAGNOSIS — R1312 Dysphagia, oropharyngeal phase: Secondary | ICD-10-CM | POA: Diagnosis not present

## 2022-12-27 DIAGNOSIS — R1310 Dysphagia, unspecified: Secondary | ICD-10-CM | POA: Diagnosis not present

## 2022-12-27 DIAGNOSIS — Z8701 Personal history of pneumonia (recurrent): Secondary | ICD-10-CM | POA: Insufficient documentation

## 2023-01-24 ENCOUNTER — Ambulatory Visit (HOSPITAL_COMMUNITY): Payer: Medicare Other | Admitting: Speech Pathology

## 2023-01-31 ENCOUNTER — Encounter (HOSPITAL_COMMUNITY): Payer: Self-pay | Admitting: Speech Pathology

## 2023-01-31 ENCOUNTER — Ambulatory Visit (HOSPITAL_COMMUNITY): Payer: Medicare Other | Attending: Gastroenterology | Admitting: Speech Pathology

## 2023-01-31 DIAGNOSIS — R1312 Dysphagia, oropharyngeal phase: Secondary | ICD-10-CM | POA: Insufficient documentation

## 2023-01-31 NOTE — Therapy (Signed)
OUTPATIENT SPEECH LANGUAGE PATHOLOGY SWALLOW EVALUATION   Patient Name: Steve Hawkins MRN: [id number] DOB:March 17, 1940, 82 y.o., male Today's Date: 01/31/2023  PCP: Carylon Perches, MD REFERRING PROVIDER: Tiffany Kocher, PA-C  END OF SESSION:  End of Session - 01/31/23 1213     Visit Number 2    Number of Visits 4    Date for SLP Re-Evaluation 02/01/23    Authorization Type Medicare    SLP Start Time 1100    SLP Stop Time  1145    SLP Time Calculation (min) 45 min    Activity Tolerance Patient tolerated treatment well             Past Medical History:  Diagnosis Date   Cancer (HCC) 12/2018   skin cancer-melanoma   Macular degeneration    Varicose veins    Past Surgical History:  Procedure Laterality Date   CHOLECYSTECTOMY     COLONOSCOPY N/A 04/23/2019   Procedure: COLONOSCOPY;  Surgeon: Malissa Hippo, MD;  [facility];  Service: Endoscopy;  Laterality: N/A;  930   ENDOVENOUS ABLATION SAPHENOUS VEIN W/ LASER     ENDOVENOUS ABLATION SAPHENOUS VEIN W/ LASER Left 12-21-2014   endovenous laser ablation left small saphenous vein  by Dr. Josephina Gip    ESOPHAGEAL DILATION N/A 11/01/2022   Procedure: ESOPHAGEAL DILATION;  Surgeon: Dolores Frame, MD;  [facility];  Service: Gastroenterology;  Laterality: N/A;   ESOPHAGOGASTRODUODENOSCOPY (EGD) WITH PROPOFOL N/A 11/01/2022   Procedure: ESOPHAGOGASTRODUODENOSCOPY (EGD) WITH PROPOFOL;  Surgeon: Dolores Frame, MD;  [facility];  Service: Gastroenterology;  Laterality: N/A;  8:00 am, asa 2   HEMORRHOID SURGERY     POLYPECTOMY  04/23/2019   Procedure: POLYPECTOMY;  Surgeon: Malissa Hippo, MD;  [facility];  Service: Endoscopy;;   Patient Active Problem List   Diagnosis Date Noted   Abnormal liver CT 10/25/2022   Abnormal CT of the chest 09/26/2022   Dysphagia 09/26/2022   Posterior vitreous detachment of left eye 07/01/2020   Intermediate stage  nonexudative age-related macular degeneration of both eyes 03/29/2020   Macular pucker, right eye 03/29/2020   Special screening for malignant neoplasms, colon 11/06/2018   Family history of colon cancer 11/06/2018   Varicose veins of left lower extremity with complications 11/17/2014   Pain in limb 03/18/2012   Phlebitis and thrombophlebitis of superficial vessels of lower extremities 03/18/2012   Varicose veins of bilateral lower extremities with other complications 03/18/2012   Diverticulosis of colon 06/05/2008   External hemorrhoids 06/04/2008   RECTAL BLEEDING, HX OF 06/04/2008    ONSET DATE: 11/15/2022   REFERRING DIAG: R13.10 (ICD-10-CM) - Dysphagia, unspecified type Z87.01 (ICD-10-CM) - History of aspiration pneumonia  THERAPY DIAG:  Dysphagia, oropharyngeal phase  Rationale for Evaluation and Treatment: Rehabilitation  SUBJECTIVE:   SUBJECTIVE STATEMENT: "I feel like I am doing fine!" Pt accompanied by: self  PERTINENT HISTORY: Steve Hawkins is a 82 y.o. male with recent hospitalization for aspiration pneumonia. Pt further reports he has had PNA a total of three times this year. Pt reports frequently getting the hiccups while eating solid food. EGD with esophageal dilation completed on 11/01/22. MBSS was completed 11/15/2022 with recommendation for short term dysphagia therapy. Pt is referred by Tana Coast, PA-C  PAIN:  Are you having pain? No  FALLS: Has patient fallen in last 6 months?  No  LIVING ENVIRONMENT: Lives with: lives with their family Lives in: House/apartment  PLOF:  Level of assistance: Independent with ADLs, Independent with IADLs Employment: Retired  PATIENT GOALS: Eat and drink what he wants   OBJECTIVE:   DIAGNOSTIC FINDINGS:  CT Chest 09/07/2022 IMPRESSION: 1. There is a 6.7 cm hypoenhancing consolidative nodular opacity at the LEFT lung base. This likely reflects an aspiration pneumonia given upstream endobronchial thickening and  plugging but malignancy is in the differential. Recommend follow-up CT in 3 months after appropriate treatment to assess for resolution. 2. There are multiple prominent mediastinal and LEFT hilar lymph nodes which are indeterminate. Recommend attention on follow-up 3. There are multiple subtle areas of enhancement of the liver. These are indeterminate. Recommend further evaluation with dedicated outpatient abdominal MRI with and without contrast when clinically appropriate.   Aortic Atherosclerosis (ICD10-I70.0) and Emphysema (ICD10-J43.9).  INSTRUMENTAL SWALLOW STUDY FINDINGS (MBSS) (11/15/2022)  Clinical Impression: <<Pt presents with mild/moderate sensorimotor oropharyngeal dysphagia characterized by min to moderate amounts of diffuse pharyngeal residue (majority of residue in the valleculae and pyriforms) followed by occasional SILENT trace to min aspiration of residue after the swallow. Note Good laryngeal vestibule closure and airway protection before and during the swallow. Decreased base of tongue retraction, decreased pharyngeal stripping wave, and partial distention of pharyngoesophageal segment opening all contribute to pharyngeal residue. Visualized two episodes of delayed aspiration and despite cued strong cough it was not effective for clearing aspirates. Note min to moderate residue across all consistencies and textures therefore suspect some trace to min silent aspiration of all consistencies/textures. Effortful swallow with cues to "hold bolus and swallow hard" was most effective for minimizing pharyngeal residue followed by an additional dry repeat swallow and occasional throat clear. 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 Recommend continue with regular diet and thin liquids with effortful swallow followed by a dry repeat swallow and occasional throat clear throughout  meals. Also reinforced the importance of frequent oral care (minimum TID). Recommend OP ST to reinforce strategies and consider EMST.>> Objective swallow impairments: reduced tongue base retraction, pharyngeal pressure, and reduced UES distention Objective recommended compensations: oral bolus hold and then effortful swallow, cough/clear throat and repeat swallow  COGNITION: Overall cognitive status: Within functional limits for tasks assessed Areas of impairment:  N/A  SUBJECTIVE DYSPHAGIA REPORTS:  Date of onset: September 2024 Reported symptoms: coughing with both solids and liquids  Current diet: regular and thin liquids  Co-morbid voice changes: No  FACTORS WHICH MAY INCREASE RISK OF ADVERSE EVENT IN PRESENCE OF ASPIRATION:  General health: well appearing  Risk factors: none evident     ORAL MOTOR EXAMINATION: Overall status: WFL Comments: N/A  CLINICAL SWALLOW ASSESSMENT:   Dentition: adequate natural dentition Vocal quality at baseline: normal Patient directly observed with POs: Yes: regular and thin liquids  Feeding: able to feed self Liquids provided by: cup [facility] Swallow Protocol:  N/A Oral phase signs and symptoms:  WNL Pharyngeal phase signs and symptoms:  None today   TODAY'S TREATMENT: Pt seen for ongoing dysphagia intervention. He reports that he is completing his pharyngeal swallowing exercises and was able to return demonstrate. He reports some difficulty with the [facility]. SLP encouraged him to complete any variation of the 4 exercises 3x per day for just 5 minutes each, going forward to help maintain and strengthen. He was cued to follow sips of liquids as follows: small sip, swallow, swallow again, periodically cough and swallow again. He was able to return demonstrate independently. Pt to  monitor for fever, shortness of breath, and chest congestion going forward and encouraged to notify his physician if any changes. Pt reports feeling "fine" with regards to  swallowing without reports of wet voice, throat clearing, globus sensation, or coughing. Recommend d/c from SLP services at this time with ongoing completion of HEP as assigned. Pt agreeable to plan of care.                                                                                                                                          DATE: 01/31/23  PATIENT EDUCATION: Education details: Provided all swallowing exercises, Pt able to return demonstrate Person educated: Patient Education method: Explanation, Demonstration, and Handouts Education comprehension: verbalized understanding, returned demonstration, and needs further education   ASSESSMENT:  CLINICAL IMPRESSION: Patient is an 82 y.o. male who was seen today for follow up from his MBSS completed in September. Clinical swallow evaluation was completed and recommendations were reviewed with Pt. Pt has been consuming regular textures and all liquids and reports that he feels it is going well. Pt was noted to silently aspirate trace amounts of thin post swallow. Recommend regular textures and thin liquids with medications whole with water, take small bites/sips, and periodically complete a quick cough and a dry swallow to reduce aspirate/residue. Pt was also instructed to complete the following exercises and were return demonstrated and provided in written form:  modified chin tuck against resistance and swallow, Masako, Mendelsohn, and lingual press. Pt instructed to complete 3x daily going forward. Rationale for precautions were explained and Pt encouraged to monitor for signs of chest congestion/fever etc. Recommend ~2-3 additional sessions for dysphagia.   OBJECTIVE IMPAIRMENTS: include dysphagia. These impairments are limiting patient from safety when swallowing. Factors affecting potential to achieve goals and functional outcome are previous level of function. Patient will benefit from skilled SLP services to address above  impairments and improve overall function.  REHAB POTENTIAL: Good   GOALS: Goals reviewed with patient? Yes  SHORT TERM GOALS: Target date: 02/01/2023  Pt will complete pharyngeal swallowing exercises including but not limited to: masako, effortful swallow, Mendelsohn, lingual press, chin tuck against resistance, and laryngeal closure with initial model provided by SLP and daily completion of 3x per day per Pt self-report Baseline: exercises unknown to Pt initially Goal status: MET  2.  Pt will verbalize signs/symptoms of aspiration, ways to minimize aspiration, and reflux precautions with use of written cues prn. Baseline: provided this date Goal status: MET   LONG TERM GOALS: Same as short term goals   PLAN:  SPEECH THERAPY DISCHARGE SUMMARY  Visits from Start of Care: 2  Current functional level related to goals / functional outcomes: Goals met   Remaining deficits: See above   Education / Equipment: HEP completed   Patient agrees to discharge. Patient goals were met. Patient is being discharged due to meeting the stated rehab  goals.Marland Kitchen    PLANNED INTERVENTIONS: [id number] Treatment of swallowing function, Aspiration precaution training, and Pharyngeal strengthening exercises   Thank you,  Havery Moros, CCC-SLP [phone]  Roquel Burgin, CCC-SLP 01/31/2023, 12:14 PM

## 2023-02-12 NOTE — Progress Notes (Signed)
History of Present Illness:   Here for f/u of BPH, elevated PSA     February, 1999- TRUS/Bx  --PSA 5.2. Path revealed  BPH and inflammatory change. Because of this PSA elevation while on finasteride, he underwent repeat TRUS/Bx on 7.8.2013. Prostatic volume was 104 mL--all 12 cores were benign, 2 revealed acute and chronic inflammation.  12.17.2024: IPSS 13  QoL 2 On finasteride and tamsulosin  He is having no side effects from either these medications.    Past Medical History:  Diagnosis Date   Cancer (HCC) 12/2018   skin cancer-melanoma   Macular degeneration    Varicose veins     Past Surgical History:  Procedure Laterality Date   CHOLECYSTECTOMY     COLONOSCOPY N/A 04/23/2019   Procedure: COLONOSCOPY;  Surgeon: Malissa Hippo, MD;  [facility];  Service: Endoscopy;  Laterality: N/A;  930   ENDOVENOUS ABLATION SAPHENOUS VEIN W/ LASER     ENDOVENOUS ABLATION SAPHENOUS VEIN W/ LASER Left 12-21-2014   endovenous laser ablation left small saphenous vein  by Dr. Josephina Gip    ESOPHAGEAL DILATION N/A 11/01/2022   Procedure: ESOPHAGEAL DILATION;  Surgeon: Dolores Frame, MD;  [facility];  Service: Gastroenterology;  Laterality: N/A;   ESOPHAGOGASTRODUODENOSCOPY (EGD) WITH PROPOFOL N/A 11/01/2022   Procedure: ESOPHAGOGASTRODUODENOSCOPY (EGD) WITH PROPOFOL;  Surgeon: Dolores Frame, MD;  [facility];  Service: Gastroenterology;  Laterality: N/A;  8:00 am, asa 2   HEMORRHOID SURGERY     POLYPECTOMY  04/23/2019   Procedure: POLYPECTOMY;  Surgeon: Malissa Hippo, MD;  [facility];  Service: Endoscopy;;    Home Medications:  Allergies as of 02/13/2023       Reactions   Bee Venom Anaphylaxis        Medication List        Accurate as of February 12, 2023  8:05 AM. If you have any questions, ask your nurse or doctor.          augmented betamethasone dipropionate 0.05 % ointment Commonly known as:  DIPROLENE-AF Apply topically 2 (two) times daily.   finasteride 5 MG tablet Commonly known as: PROSCAR TAKE 1 TABLET (5 MG TOTAL) BY MOUTH DAILY.   omeprazole 40 MG capsule Commonly known as: PRILOSEC Take 1 capsule (40 mg total) by mouth daily.   PreserVision AREDS 2 Caps Take 1 capsule by mouth in the morning and at bedtime.   sildenafil 100 MG tablet Commonly known as: VIAGRA 1/2 to 1 tablet p.o. as needed What changed:  how much to take how to take this when to take this reasons to take this additional instructions   tamsulosin 0.4 MG Caps capsule Commonly known as: FLOMAX TAKE 1 CAPSULE BY MOUTH EVERY DAY        Allergies:  Allergies  Allergen Reactions   Bee Venom Anaphylaxis    Family History  Problem Relation Age of Onset   Colon cancer Maternal Aunt     Social History:  reports that he has quit smoking. He has never used smokeless tobacco. He reports current alcohol use of about 4.0 standard drinks of alcohol per week. He reports that he does not use drugs.  ROS: A complete review of systems was performed.  All systems are negative except for pertinent findings as noted.  Physical Exam:  Vital signs in last 24 hours: There were no vitals taken for this visit. Constitutional:  Alert and oriented, No acute distress Cardiovascular: Regular rate  Respiratory: Normal respiratory effor Genitourinary: Normal anal sphincter tone.  Prostate is 50 g, symmetric, nonnodular, nontender Neurologic: Grossly intact, no focal deficits Psychiatric: Normal mood and affect  I have reviewed prior pt notes  I have reviewed urinalysis results-clear  I have independently reviewed prior imaging-prostate ultrasound  I have reviewed prior PSA results  I have reviewed prior pathology results   Impression/Assessment:  1.  Elevated PSA with negative biopsy in the past, not following anymore  2.  BPH, symptoms stable, nonbothersome  Plan:  1.  Continue dual  medical therapy  2.  I will see him back in 1 year to recheck

## 2023-02-13 ENCOUNTER — Ambulatory Visit (INDEPENDENT_AMBULATORY_CARE_PROVIDER_SITE_OTHER): Payer: Medicare Other | Admitting: Urology

## 2023-02-13 ENCOUNTER — Encounter: Payer: Self-pay | Admitting: Urology

## 2023-02-13 VITALS — BP 106/64 | HR 54

## 2023-02-13 DIAGNOSIS — R972 Elevated prostate specific antigen [PSA]: Secondary | ICD-10-CM | POA: Diagnosis not present

## 2023-02-13 DIAGNOSIS — N529 Male erectile dysfunction, unspecified: Secondary | ICD-10-CM | POA: Diagnosis not present

## 2023-02-13 DIAGNOSIS — N401 Enlarged prostate with lower urinary tract symptoms: Secondary | ICD-10-CM | POA: Diagnosis not present

## 2023-02-13 DIAGNOSIS — R339 Retention of urine, unspecified: Secondary | ICD-10-CM | POA: Diagnosis not present

## 2023-02-13 LAB — URINALYSIS, ROUTINE W REFLEX MICROSCOPIC
Bilirubin, UA: NEGATIVE
Glucose, UA: NEGATIVE
Ketones, UA: NEGATIVE
Nitrite, UA: NEGATIVE
Protein,UA: NEGATIVE
RBC, UA: NEGATIVE
Specific Gravity, UA: 1.03 (ref 1.005–1.030)
Urobilinogen, Ur: 1 mg/dL (ref 0.2–1.0)
pH, UA: 6 (ref 5.0–7.5)

## 2023-02-13 LAB — MICROSCOPIC EXAMINATION

## 2023-02-16 DIAGNOSIS — Z23 Encounter for immunization: Secondary | ICD-10-CM | POA: Diagnosis not present

## 2023-03-14 DIAGNOSIS — Z23 Encounter for immunization: Secondary | ICD-10-CM | POA: Diagnosis not present

## 2023-03-28 DIAGNOSIS — H353221 Exudative age-related macular degeneration, left eye, with active choroidal neovascularization: Secondary | ICD-10-CM | POA: Diagnosis not present

## 2023-03-28 DIAGNOSIS — H353132 Nonexudative age-related macular degeneration, bilateral, intermediate dry stage: Secondary | ICD-10-CM | POA: Diagnosis not present

## 2023-03-28 DIAGNOSIS — H35371 Puckering of macula, right eye: Secondary | ICD-10-CM | POA: Diagnosis not present

## 2023-03-28 DIAGNOSIS — H43812 Vitreous degeneration, left eye: Secondary | ICD-10-CM | POA: Diagnosis not present

## 2023-04-27 ENCOUNTER — Other Ambulatory Visit: Payer: Self-pay | Admitting: Urology

## 2023-04-27 DIAGNOSIS — N401 Enlarged prostate with lower urinary tract symptoms: Secondary | ICD-10-CM

## 2023-05-09 DIAGNOSIS — H35371 Puckering of macula, right eye: Secondary | ICD-10-CM | POA: Diagnosis not present

## 2023-05-09 DIAGNOSIS — H353132 Nonexudative age-related macular degeneration, bilateral, intermediate dry stage: Secondary | ICD-10-CM | POA: Diagnosis not present

## 2023-05-09 DIAGNOSIS — H353221 Exudative age-related macular degeneration, left eye, with active choroidal neovascularization: Secondary | ICD-10-CM | POA: Diagnosis not present

## 2023-05-09 DIAGNOSIS — H43812 Vitreous degeneration, left eye: Secondary | ICD-10-CM | POA: Diagnosis not present

## 2023-05-15 DIAGNOSIS — H353111 Nonexudative age-related macular degeneration, right eye, early dry stage: Secondary | ICD-10-CM | POA: Diagnosis not present

## 2023-05-15 DIAGNOSIS — H353221 Exudative age-related macular degeneration, left eye, with active choroidal neovascularization: Secondary | ICD-10-CM | POA: Diagnosis not present

## 2023-05-15 DIAGNOSIS — Z961 Presence of intraocular lens: Secondary | ICD-10-CM | POA: Diagnosis not present

## 2023-06-20 DIAGNOSIS — H353132 Nonexudative age-related macular degeneration, bilateral, intermediate dry stage: Secondary | ICD-10-CM | POA: Diagnosis not present

## 2023-06-20 DIAGNOSIS — H353221 Exudative age-related macular degeneration, left eye, with active choroidal neovascularization: Secondary | ICD-10-CM | POA: Diagnosis not present

## 2023-06-20 DIAGNOSIS — H43812 Vitreous degeneration, left eye: Secondary | ICD-10-CM | POA: Diagnosis not present

## 2023-06-20 DIAGNOSIS — H35371 Puckering of macula, right eye: Secondary | ICD-10-CM | POA: Diagnosis not present

## 2023-08-08 DIAGNOSIS — H43812 Vitreous degeneration, left eye: Secondary | ICD-10-CM | POA: Diagnosis not present

## 2023-08-08 DIAGNOSIS — H353221 Exudative age-related macular degeneration, left eye, with active choroidal neovascularization: Secondary | ICD-10-CM | POA: Diagnosis not present

## 2023-08-08 DIAGNOSIS — H35371 Puckering of macula, right eye: Secondary | ICD-10-CM | POA: Diagnosis not present

## 2023-08-08 DIAGNOSIS — H353132 Nonexudative age-related macular degeneration, bilateral, intermediate dry stage: Secondary | ICD-10-CM | POA: Diagnosis not present

## 2023-08-21 DIAGNOSIS — R001 Bradycardia, unspecified: Secondary | ICD-10-CM | POA: Diagnosis not present

## 2023-08-21 DIAGNOSIS — Z79899 Other long term (current) drug therapy: Secondary | ICD-10-CM | POA: Diagnosis not present

## 2023-08-21 DIAGNOSIS — H3530 Unspecified macular degeneration: Secondary | ICD-10-CM | POA: Diagnosis not present

## 2023-08-21 DIAGNOSIS — N400 Enlarged prostate without lower urinary tract symptoms: Secondary | ICD-10-CM | POA: Diagnosis not present

## 2023-08-21 DIAGNOSIS — M5186 Other intervertebral disc disorders, lumbar region: Secondary | ICD-10-CM | POA: Diagnosis not present

## 2023-08-28 DIAGNOSIS — N400 Enlarged prostate without lower urinary tract symptoms: Secondary | ICD-10-CM | POA: Diagnosis not present

## 2023-08-28 DIAGNOSIS — Z79899 Other long term (current) drug therapy: Secondary | ICD-10-CM | POA: Diagnosis not present

## 2023-08-28 DIAGNOSIS — D7589 Other specified diseases of blood and blood-forming organs: Secondary | ICD-10-CM | POA: Diagnosis not present

## 2023-08-28 DIAGNOSIS — R001 Bradycardia, unspecified: Secondary | ICD-10-CM | POA: Diagnosis not present

## 2023-08-28 DIAGNOSIS — K922 Gastrointestinal hemorrhage, unspecified: Secondary | ICD-10-CM | POA: Diagnosis not present

## 2023-08-28 DIAGNOSIS — R5382 Chronic fatigue, unspecified: Secondary | ICD-10-CM | POA: Diagnosis not present

## 2023-08-28 DIAGNOSIS — Z85820 Personal history of malignant melanoma of skin: Secondary | ICD-10-CM | POA: Diagnosis not present

## 2023-08-29 ENCOUNTER — Encounter (INDEPENDENT_AMBULATORY_CARE_PROVIDER_SITE_OTHER): Payer: Self-pay | Admitting: *Deleted

## 2023-09-10 ENCOUNTER — Encounter (INDEPENDENT_AMBULATORY_CARE_PROVIDER_SITE_OTHER): Payer: Self-pay | Admitting: *Deleted

## 2023-09-10 ENCOUNTER — Ambulatory Visit (INDEPENDENT_AMBULATORY_CARE_PROVIDER_SITE_OTHER): Admitting: Gastroenterology

## 2023-09-10 ENCOUNTER — Other Ambulatory Visit (INDEPENDENT_AMBULATORY_CARE_PROVIDER_SITE_OTHER): Payer: Self-pay | Admitting: *Deleted

## 2023-09-10 ENCOUNTER — Encounter (INDEPENDENT_AMBULATORY_CARE_PROVIDER_SITE_OTHER): Payer: Self-pay | Admitting: Gastroenterology

## 2023-09-10 VITALS — BP 120/73 | HR 74 | Temp 97.1°F | Ht 68.0 in | Wt 145.1 lb

## 2023-09-10 DIAGNOSIS — K648 Other hemorrhoids: Secondary | ICD-10-CM | POA: Diagnosis not present

## 2023-09-10 DIAGNOSIS — K625 Hemorrhage of anus and rectum: Secondary | ICD-10-CM | POA: Insufficient documentation

## 2023-09-10 DIAGNOSIS — K644 Residual hemorrhoidal skin tags: Secondary | ICD-10-CM

## 2023-09-10 DIAGNOSIS — K6289 Other specified diseases of anus and rectum: Secondary | ICD-10-CM

## 2023-09-10 MED ORDER — PEG 3350-KCL-NA BICARB-NACL 420 G PO SOLR: 4000.0000 mL | Freq: Once | ORAL | 0 refills | Status: AC

## 2023-09-10 MED ORDER — HYDROCORTISONE ACETATE 25 MG RE SUPP
25.0000 mg | Freq: Two times a day (BID) | RECTAL | 1 refills | Status: AC
Start: 2023-09-10 — End: ?

## 2023-09-10 NOTE — Patient Instructions (Addendum)
 Start Anusol  suppositories twice a day for 1 week Start taking Miralax 1 capful every day to keep stools soft.  Can increase to 2-3 capfuls to achieve soft stools. Schedule colonoscopy

## 2023-09-10 NOTE — H&P (View-Only) (Signed)
 Steve Hawkins, M.D. Gastroenterology & Hepatology [facility]ology [address] [place], [place] [address]  Primary Care Physician: Sheryle Carwin, MD [address] [place] [address]  I will communicate my assessment and recommendations to the referring MD via EMR.  Problems: Rectal bleeding Internal and external hemorrhoids.  History of Present Illness: Steve Hawkins is a 83 y.o. male with past medical history of melanoma, macular degeneration, who presents for evaluation of rectal bleeding.  The patient was last seen on 10/23/2022. At that time, the patient was scheduled for EGD for dysphagia, but also speech and swallow evaluation for episodes of aspiration pneumonia.  Patient reports that for the last 1-2 months he has seen intermittent red blood in the stools. He got concerned as today he had a large amount of blood when defecating without stool. Sometimes he can have some dyschezia. He reports that his stools are very hard but moves his stools almost every day. The patient denies having any nausea, vomiting, fever, chills, melena, hematemesis, abdominal distention, abdominal pain, diarrhea, jaundice, pruritus or weight loss.  Most recent labs from 08/21/2023 showed a hemoglobin of 15.6, MCV 104, WBC 5.4 and platelets 238.  Patient had a hemorrhoidectomy in the past, possibly 10 years ago.  Last EGD: 11/01/22 - Non- obstructing Schatzki ring. Dilated. - 3 cm hiatal hernia. - Normal stomach. - Normal examined duodenum.  Last Colonoscopy: 04/23/19 - Perianal skin tags found on perianal exam. - One small polyp at the appendiceal orifice. Biopsied. - Six small polyps in the sigmoid colon and in the ascending colon. Biopsied. - Diverticulosis in the sigmoid colon. - External hemorrhoids.  Appendiceal polyp was hyperplastic and the other polyps were tubular adenomas and sessile serrated polyps.  Patient was advised to have a repeat  colonoscopy in 5 years.  Past Medical History: Past Medical History:  Diagnosis Date   Cancer (HCC) 12/2018   skin cancer-melanoma   Macular degeneration    Varicose veins     Past Surgical History: Past Surgical History:  Procedure Laterality Date   CHOLECYSTECTOMY     COLONOSCOPY N/A 04/23/2019   Procedure: COLONOSCOPY;  Surgeon: Golda Claudis PENNER, MD;  [facility];  Service: Endoscopy;  Laterality: N/A;  930   ENDOVENOUS ABLATION SAPHENOUS VEIN W/ LASER     ENDOVENOUS ABLATION SAPHENOUS VEIN W/ LASER Left 12-21-2014   endovenous laser ablation left small saphenous vein  by Dr. Lynwood Collum    ESOPHAGEAL DILATION N/A 11/01/2022   Procedure: ESOPHAGEAL DILATION;  Surgeon: Hawkins Angelia Toribio, MD;  [facility];  Service: Gastroenterology;  Laterality: N/A;   ESOPHAGOGASTRODUODENOSCOPY (EGD) WITH PROPOFOL  N/A 11/01/2022   Procedure: ESOPHAGOGASTRODUODENOSCOPY (EGD) WITH PROPOFOL ;  Surgeon: Hawkins Angelia Toribio, MD;  [facility];  Service: Gastroenterology;  Laterality: N/A;  8:00 am, asa 2   HEMORRHOID SURGERY     POLYPECTOMY  04/23/2019   Procedure: POLYPECTOMY;  Surgeon: Golda Claudis PENNER, MD;  [facility];  Service: Endoscopy;;    Family History: Family History  Problem Relation Age of Onset   Colon cancer Maternal Aunt     Social History: Social History   Tobacco Use  Smoking Status Former  Smokeless Tobacco Never   Social History   Substance and Sexual Activity  Alcohol Use Yes   Alcohol/week: 4.0 standard drinks of alcohol   Types: 1 Cans of beer, 1 Shots of liquor, 2 Standard drinks or equivalent per week   Comment:  daily scotch.   Social History   Substance and Sexual Activity  Drug Use No    Allergies: Allergies  Allergen Reactions   Bee Venom Anaphylaxis    Medications: Current Outpatient Medications  Medication Sig Dispense Refill   finasteride  (PROSCAR ) 5 MG tablet TAKE 1 TABLET (5 MG TOTAL)  BY MOUTH DAILY. 90 tablet 3   Multiple Vitamins-Minerals (ICAPS AREDS 2 PO) Take by mouth 2 (two) times daily.     sildenafil  (VIAGRA ) 100 MG tablet 1/2 to 1 tablet p.o. as needed 30 tablet 11   tamsulosin  (FLOMAX ) 0.4 MG CAPS capsule TAKE 1 CAPSULE BY MOUTH EVERY DAY 90 capsule 3   No current facility-administered medications for this visit.    Review of Systems: GENERAL: negative for malaise, night sweats HEENT: No changes in hearing or vision, no nose bleeds or other nasal problems. NECK: Negative for lumps, goiter, pain and significant neck swelling RESPIRATORY: Negative for cough, wheezing CARDIOVASCULAR: Negative for chest pain, leg swelling, palpitations, orthopnea GI: SEE HPI MUSCULOSKELETAL: Negative for joint pain or swelling, back pain, and muscle pain. SKIN: Negative for lesions, rash PSYCH: Negative for sleep disturbance, mood disorder and recent psychosocial stressors. HEMATOLOGY Negative for prolonged bleeding, bruising easily, and swollen nodes. ENDOCRINE: Negative for cold or heat intolerance, polyuria, polydipsia and goiter. NEURO: negative for tremor, gait imbalance, syncope and seizures. The remainder of the review of systems is noncontributory.   Physical Exam: BP 120/73 (BP Location: Left Arm, Patient Position: Sitting, Cuff Size: Normal)   Pulse 74   Temp (!) 97.1 F (36.2 C) (Temporal)   Ht 5' 8 (1.727 m)   Wt 145 lb 1.6 oz (65.8 kg)   BMI 22.06 kg/m  GENERAL: The patient is AO x3, in no acute distress. HEENT: Head is normocephalic and atraumatic. EOMI are intact. Mouth is well hydrated and without lesions. NECK: Supple. No masses LUNGS: Clear to auscultation. No presence of rhonchi/wheezing/rales. Adequate chest expansion HEART: RRR, normal s1 and s2. ABDOMEN: Soft, nontender, no guarding, no peritoneal signs, and nondistended. BS +. No masses. RECTAL EXAM: Very small external hemorrhoids, presence of protruding internal hemorrhoids along with boggy  mucosa on DRE, normal tone, no hard masses, brown stool without blood. Chaperone: Camelia Clarity, CMA EXTREMITIES: Without any cyanosis, clubbing, rash, lesions or edema. NEUROLOGIC: AOx3, no focal motor deficit. SKIN: no jaundice, no rashes  Imaging/Labs: as above  I personally reviewed and interpreted the available labs, imaging and endoscopic files.  Impression and Plan: Steve Hawkins is a 83 y.o. male with past medical history of melanoma, macular degeneration, who presents for evaluation of rectal bleeding.  Patient has presented recurrent episodes of rectal bleeding with some rectal discomfort with defecation.  Not presenting any other red flag signs.  Physical exam shows presence of internal and some mild external hemorrhoids, which have likely led to symptoms.  However, we discussed the importance of evaluating other sources of rectal bleeding with a colonoscopy which he is agreeable to proceed with.  For now, he will be given a course of Anusol  suppositories and I also advised him to take MiraLAX on a regular basis to keep his stool soft.  -Start Anusol  suppositories twice a day for 1 week -Start taking Miralax 1 capful every day to keep stools soft.  Can increase to 2-3 capfuls to achieve soft stools. -Schedule colonoscopy  All questions were answered.      Steve Fortune, MD Gastroenterology and Hepatology [facility]

## 2023-09-10 NOTE — Progress Notes (Signed)
 Toribio Fortune, M.D. Gastroenterology & Hepatology [facility] G[facility]enterology [address] [place], [place] [address]  Primary Care Physician: Sheryle Carwin, MD [address] [place] [address]  I will communicate my assessment and recommendations to the referring MD via EMR.  Problems: Rectal bleeding Internal and external hemorrhoids.  History of Present Illness: Steve Hawkins is a 83 y.o. male with past medical history of melanoma, macular degeneration, who presents for evaluation of rectal bleeding.  The patient was last seen on 10/23/2022. At that time, the patient was scheduled for EGD for dysphagia, but also speech and swallow evaluation for episodes of aspiration pneumonia.  Patient reports that for the last 1-2 months he has seen intermittent red blood in the stools. He got concerned as today he had a large amount of blood when defecating without stool. Sometimes he can have some dyschezia. He reports that his stools are very hard but moves his stools almost every day. The patient denies having any nausea, vomiting, fever, chills, melena, hematemesis, abdominal distention, abdominal pain, diarrhea, jaundice, pruritus or weight loss.  Most recent labs from 08/21/2023 showed a hemoglobin of 15.6, MCV 104, WBC 5.4 and platelets 238.  Patient had a hemorrhoidectomy in the past, possibly 10 years ago.  Last EGD: 11/01/22 - Non- obstructing Schatzki ring. Dilated. - 3 cm hiatal hernia. - Normal stomach. - Normal examined duodenum.  Last Colonoscopy: 04/23/19 - Perianal skin tags found on perianal exam. - One small polyp at the appendiceal orifice. Biopsied. - Six small polyps in the sigmoid colon and in the ascending colon. Biopsied. - Diverticulosis in the sigmoid colon. - External hemorrhoids.  Appendiceal polyp was hyperplastic and the other polyps were tubular adenomas and sessile serrated polyps.  Patient was advised to have a repeat  colonoscopy in 5 years.  Past Medical History: Past Medical History:  Diagnosis Date   Cancer (HCC) 12/2018   skin cancer-melanoma   Macular degeneration    Varicose veins     Past Surgical History: Past Surgical History:  Procedure Laterality Date   CHOLECYSTECTOMY     COLONOSCOPY N/A 04/23/2019   Procedure: COLONOSCOPY;  Surgeon: Golda Claudis PENNER, MD;  [facility];  Service: Endoscopy;  Laterality: N/A;  930   ENDOVENOUS ABLATION SAPHENOUS VEIN W/ LASER     ENDOVENOUS ABLATION SAPHENOUS VEIN W/ LASER Left 12-21-2014   endovenous laser ablation left small saphenous vein  by Dr. Lynwood Collum    ESOPHAGEAL DILATION N/A 11/01/2022   Procedure: ESOPHAGEAL DILATION;  Surgeon: Fortune Angelia Toribio, MD;  [facility];  Service: Gastroenterology;  Laterality: N/A;   ESOPHAGOGASTRODUODENOSCOPY (EGD) WITH PROPOFOL  N/A 11/01/2022   Procedure: ESOPHAGOGASTRODUODENOSCOPY (EGD) WITH PROPOFOL ;  Surgeon: Fortune Angelia Toribio, MD;  [facility];  Service: Gastroenterology;  Laterality: N/A;  8:00 am, asa 2   HEMORRHOID SURGERY     POLYPECTOMY  04/23/2019   Procedure: POLYPECTOMY;  Surgeon: Golda Claudis PENNER, MD;  [facility];  Service: Endoscopy;;    Family History: Family History  Problem Relation Age of Onset   Colon cancer Maternal Aunt     Social History: Social History   Tobacco Use  Smoking Status Former  Smokeless Tobacco Never   Social History   Substance and Sexual Activity  Alcohol Use Yes   Alcohol/week: 4.0 standard drinks of alcohol   Types: 1 Cans of beer, 1 Shots of liquor, 2 Standard drinks or equivalent per week   Comment:  daily scotch.   Social History   Substance and Sexual Activity  Drug Use No    Allergies: Allergies  Allergen Reactions   Bee Venom Anaphylaxis    Medications: Current Outpatient Medications  Medication Sig Dispense Refill   finasteride  (PROSCAR ) 5 MG tablet TAKE 1 TABLET (5 MG TOTAL)  BY MOUTH DAILY. 90 tablet 3   Multiple Vitamins-Minerals (ICAPS AREDS 2 PO) Take by mouth 2 (two) times daily.     sildenafil  (VIAGRA ) 100 MG tablet 1/2 to 1 tablet p.o. as needed 30 tablet 11   tamsulosin  (FLOMAX ) 0.4 MG CAPS capsule TAKE 1 CAPSULE BY MOUTH EVERY DAY 90 capsule 3   No current facility-administered medications for this visit.    Review of Systems: GENERAL: negative for malaise, night sweats HEENT: No changes in hearing or vision, no nose bleeds or other nasal problems. NECK: Negative for lumps, goiter, pain and significant neck swelling RESPIRATORY: Negative for cough, wheezing CARDIOVASCULAR: Negative for chest pain, leg swelling, palpitations, orthopnea GI: SEE HPI MUSCULOSKELETAL: Negative for joint pain or swelling, back pain, and muscle pain. SKIN: Negative for lesions, rash PSYCH: Negative for sleep disturbance, mood disorder and recent psychosocial stressors. HEMATOLOGY Negative for prolonged bleeding, bruising easily, and swollen nodes. ENDOCRINE: Negative for cold or heat intolerance, polyuria, polydipsia and goiter. NEURO: negative for tremor, gait imbalance, syncope and seizures. The remainder of the review of systems is noncontributory.   Physical Exam: BP 120/73 (BP Location: Left Arm, Patient Position: Sitting, Cuff Size: Normal)   Pulse 74   Temp (!) 97.1 F (36.2 C) (Temporal)   Ht 5' 8 (1.727 m)   Wt 145 lb 1.6 oz (65.8 kg)   BMI 22.06 kg/m  GENERAL: The patient is AO x3, in no acute distress. HEENT: Head is normocephalic and atraumatic. EOMI are intact. Mouth is well hydrated and without lesions. NECK: Supple. No masses LUNGS: Clear to auscultation. No presence of rhonchi/wheezing/rales. Adequate chest expansion HEART: RRR, normal s1 and s2. ABDOMEN: Soft, nontender, no guarding, no peritoneal signs, and nondistended. BS +. No masses. RECTAL EXAM: Very small external hemorrhoids, presence of protruding internal hemorrhoids along with boggy  mucosa on DRE, normal tone, no hard masses, brown stool without blood. Chaperone: Camelia Clarity, CMA EXTREMITIES: Without any cyanosis, clubbing, rash, lesions or edema. NEUROLOGIC: AOx3, no focal motor deficit. SKIN: no jaundice, no rashes  Imaging/Labs: as above  I personally reviewed and interpreted the available labs, imaging and endoscopic files.  Impression and Plan: Steve Hawkins is a 83 y.o. male with past medical history of melanoma, macular degeneration, who presents for evaluation of rectal bleeding.  Patient has presented recurrent episodes of rectal bleeding with some rectal discomfort with defecation.  Not presenting any other red flag signs.  Physical exam shows presence of internal and some mild external hemorrhoids, which have likely led to symptoms.  However, we discussed the importance of evaluating other sources of rectal bleeding with a colonoscopy which he is agreeable to proceed with.  For now, he will be given a course of Anusol  suppositories and I also advised him to take MiraLAX on a regular basis to keep his stool soft.  -Start Anusol  suppositories twice a day for 1 week -Start taking Miralax 1 capful every day to keep stools soft.  Can increase to 2-3 capfuls to achieve soft stools. -Schedule colonoscopy  All questions were answered.      Toribio Fortune, MD Gastroenterology and Hepatology [facility]

## 2023-09-17 ENCOUNTER — Encounter (HOSPITAL_COMMUNITY): Payer: Self-pay

## 2023-09-17 ENCOUNTER — Encounter (HOSPITAL_COMMUNITY)
Admission: RE | Admit: 2023-09-17 | Discharge: 2023-09-17 | Disposition: A | Discharge status: Home / Self Care | Source: Ambulatory Visit | Attending: Gastroenterology | Admitting: Gastroenterology

## 2023-09-17 ENCOUNTER — Other Ambulatory Visit: Payer: Self-pay

## 2023-09-20 ENCOUNTER — Ambulatory Visit (HOSPITAL_COMMUNITY): Payer: Self-pay | Admitting: Anesthesiology

## 2023-09-20 ENCOUNTER — Encounter (HOSPITAL_COMMUNITY)
Admission: RE | Disposition: A | Discharge status: Home / Self Care | Payer: Self-pay | Source: Home / Self Care | Attending: Gastroenterology

## 2023-09-20 ENCOUNTER — Ambulatory Visit (HOSPITAL_COMMUNITY)
Admission: RE | Admit: 2023-09-20 | Discharge: 2023-09-20 | Disposition: A | Discharge status: Home / Self Care | Attending: Gastroenterology | Admitting: Gastroenterology

## 2023-09-20 ENCOUNTER — Encounter (INDEPENDENT_AMBULATORY_CARE_PROVIDER_SITE_OTHER): Payer: Self-pay | Admitting: *Deleted

## 2023-09-20 DIAGNOSIS — D125 Benign neoplasm of sigmoid colon: Secondary | ICD-10-CM | POA: Diagnosis not present

## 2023-09-20 DIAGNOSIS — K648 Other hemorrhoids: Secondary | ICD-10-CM | POA: Diagnosis not present

## 2023-09-20 DIAGNOSIS — K644 Residual hemorrhoidal skin tags: Secondary | ICD-10-CM | POA: Diagnosis not present

## 2023-09-20 DIAGNOSIS — K635 Polyp of colon: Secondary | ICD-10-CM | POA: Diagnosis not present

## 2023-09-20 DIAGNOSIS — I10 Essential (primary) hypertension: Secondary | ICD-10-CM | POA: Diagnosis not present

## 2023-09-20 DIAGNOSIS — K625 Hemorrhage of anus and rectum: Secondary | ICD-10-CM

## 2023-09-20 DIAGNOSIS — K5730 Diverticulosis of large intestine without perforation or abscess without bleeding: Secondary | ICD-10-CM

## 2023-09-20 DIAGNOSIS — R197 Diarrhea, unspecified: Secondary | ICD-10-CM | POA: Insufficient documentation

## 2023-09-20 DIAGNOSIS — Z79899 Other long term (current) drug therapy: Secondary | ICD-10-CM | POA: Insufficient documentation

## 2023-09-20 DIAGNOSIS — D120 Benign neoplasm of cecum: Secondary | ICD-10-CM | POA: Diagnosis not present

## 2023-09-20 DIAGNOSIS — Z87891 Personal history of nicotine dependence: Secondary | ICD-10-CM | POA: Diagnosis not present

## 2023-09-20 HISTORY — PX: COLONOSCOPY: SHX5424

## 2023-09-20 LAB — HM COLONOSCOPY

## 2023-09-20 SURGERY — COLONOSCOPY
Anesthesia: General

## 2023-09-20 MED ORDER — PROPOFOL 500 MG/50ML IV EMUL
INTRAVENOUS | Status: DC | PRN
  Administered 2023-09-20: 100 ug/kg/min via INTRAVENOUS
  Administered 2023-09-20: 150 mg via INTRAVENOUS

## 2023-09-20 MED ORDER — LACTATED RINGERS IV SOLN: INTRAVENOUS | Status: DC

## 2023-09-20 NOTE — Op Note (Signed)
 [facility] Patient Name: Steve Hawkins Procedure Date: 09/20/2023 7:16 AM MRN: [id number] Date of Birth: 21-Mar-1940 Attending MD: Toribio Fortune , , [id number] CSN: [id number] Age: 83 Admit Type: Outpatient Procedure:                Colonoscopy Indications:              Rectal bleeding Providers:                Toribio Fortune, Jon LABOR. Museum/gallery exhibitions officer, Charity fundraiser,                            Mickel CROME. Shirlean Balm, Technician Referring MD:              Medicines:                Monitored Anesthesia Care Complications:            No immediate complications. Estimated Blood Loss:     Estimated blood loss: none. Procedure:                Pre-Anesthesia Assessment:                           - Prior to the procedure, a History and Physical                            was performed, and patient medications, allergies                            and sensitivities were reviewed. The patient's                            tolerance of previous anesthesia was reviewed.                           - The risks and benefits of the procedure and the                            sedation options and risks were discussed with the                            patient. All questions were answered and informed                            consent was obtained.                           - ASA Grade Assessment: II - A patient with mild                            systemic disease.                           After obtaining informed consent, the colonoscope                            was passed under direct vision. Throughout the  procedure, the patient's blood pressure, pulse, and                            oxygen saturations were monitored continuously. The                            [id number]-HQ190L ([id number]) scope was introduced through                            the anus and advanced to the the cecum, identified                            by appendiceal orifice and ileocecal valve. The                             colonoscopy was performed without difficulty. The                            patient tolerated the procedure well. The quality                            of the bowel preparation was good. Scope In: 7:43:19 AM Scope Out: 8:12:17 AM Scope Withdrawal Time: 0 hours 22 minutes 2 seconds  Total Procedure Duration: 0 hours 28 minutes 58 seconds  Findings:      Hemorrhoids were found on perianal exam.      Two sessile polyps were found in the cecum and appendiceal orifice. The       polyps were 3 to 8 mm in size. These polyps were removed with a cold       snare. Resection and retrieval were complete.      A 20 mm polyp was found in the distal sigmoid colon. The polyp was       pedunculated. The polyp was removed with a hot snare. Resection and       retrieval were complete. To prevent bleeding after the polypectomy, one       hemostatic clip was successfully placed (MR safe). Clip manufacturer:       [employer]. There was no bleeding at the end of the procedure.      Scattered medium-mouthed and small-mouthed diverticula were found in the       sigmoid colon, descending colon and ascending colon.      Non-bleeding external and internal hemorrhoids were found during       retroflexion and during perianal exam. The hemorrhoids were small. Impression:               - Hemorrhoids found on perianal exam.                           - Two 3 to 8 mm polyps in the cecum and at the                            appendiceal orifice, removed with a cold snare.                            Resected and retrieved.                           -  One 20 mm polyp in the distal sigmoid colon,                            removed with a hot snare. Resected and retrieved.                            Clip manufacturer: [employer]. Clip (MR                            safe) was placed.                           - Diverticulosis in the sigmoid colon, in the                            descending  colon and in the ascending colon.                           - Non-bleeding external and internal hemorrhoids. Moderate Sedation:      Per Anesthesia Care Recommendation:           - Discharge patient to home (ambulatory).                           - Resume previous diet.                           - Await pathology results.                           - Repeat colonoscopy for surveillance based on                            pathology results. Procedure Code(s):        --- Professional ---                           [phone], Colonoscopy, flexible; with removal of                            tumor(s), polyp(s), or other lesion(s) by snare                            technique Diagnosis Code(s):        --- Professional ---                           D12.0, Benign neoplasm of cecum                           D12.1, Benign neoplasm of appendix                           D12.5, Benign neoplasm of sigmoid colon                           K64.8, Other hemorrhoids  K62.5, Hemorrhage of anus and rectum                           K57.30, Diverticulosis of large intestine without                            perforation or abscess without bleeding CPT copyright 2022 American Medical Association. All rights reserved. The codes documented in this report are preliminary and upon coder review may  be revised to meet current compliance requirements. Toribio Fortune, MD Toribio Fortune,  09/20/2023 8:19:49 AM This report has been signed electronically. Number of Addenda: 0

## 2023-09-20 NOTE — Anesthesia Procedure Notes (Signed)
 Date/Time: 09/20/2023 7:38 AM  Performed by: Barbarann Verneita RAMAN, CRNAPre-anesthesia Checklist: Patient identified, Emergency Drugs available, Suction available, Timeout performed and Patient being monitored Patient Re-evaluated:Patient Re-evaluated prior to induction Oxygen Delivery Method: Nasal Cannula

## 2023-09-20 NOTE — Interval H&P Note (Signed)
 History and Physical Interval Note:  09/20/2023 7:31 AM  Steve Hawkins  has presented today for surgery, with the diagnosis of rectal bleeding.  The various methods of treatment have been discussed with the patient and family. After consideration of risks, benefits and other options for treatment, the patient has consented to  Procedure(s) with comments: COLONOSCOPY (N/A) - 730am, asa 1-2 as a surgical intervention.  The patient's history has been reviewed, patient examined, no change in status, stable for surgery.  I have reviewed the patient's chart and labs.  Questions were answered to the patient's satisfaction.     Crisanto Nied Castaneda Mayorga

## 2023-09-20 NOTE — Discharge Instructions (Signed)
 You are being discharged to home.  Resume your previous diet.  We are waiting for your pathology results.  Your physician has recommended a repeat colonoscopy for surveillance based on pathology results.

## 2023-09-20 NOTE — Transfer of Care (Signed)
 Immediate Anesthesia Transfer of Care Note  Patient: Steve Hawkins  Procedure(s) Performed: COLONOSCOPY  Patient Location: Short Stay  Anesthesia Type:General  Level of Consciousness: awake and patient cooperative  Airway & Oxygen Therapy: Patient Spontanous Breathing  Post-op Assessment: Report given to RN and Post -op Vital signs reviewed and stable  Post vital signs: Reviewed and stable  Last Vitals:  Vitals Value Taken Time  BP 105/53 09/20/23 08:18  Temp 36.4 C 09/20/23 08:18  Pulse 56 09/20/23 08:18  Resp 18 09/20/23 08:18  SpO2 100 % 09/20/23 08:18    Last Pain:  Vitals:   09/20/23 0818  TempSrc: Axillary  PainSc: 0-No pain      Patients Stated Pain Goal: 6 (09/20/23 0646)  Complications: No notable events documented.

## 2023-09-20 NOTE — Anesthesia Preprocedure Evaluation (Signed)

## 2023-09-21 ENCOUNTER — Encounter (HOSPITAL_COMMUNITY): Payer: Self-pay | Admitting: Gastroenterology

## 2023-09-21 LAB — SURGICAL PATHOLOGY

## 2023-09-22 NOTE — Anesthesia Postprocedure Evaluation (Signed)
 Anesthesia Post Note  Patient: Steve Hawkins  Procedure(s) Performed: COLONOSCOPY  Patient location during evaluation: Phase II Anesthesia Type: General Level of consciousness: awake Pain management: pain level controlled Vital Signs Assessment: post-procedure vital signs reviewed and stable Respiratory status: spontaneous breathing and respiratory function stable Cardiovascular status: blood pressure returned to baseline and stable Postop Assessment: no headache and no apparent nausea or vomiting Anesthetic complications: no Comments: Late entry   No notable events documented.   Last Vitals:  Vitals:   09/20/23 0646 09/20/23 0818  BP: 128/72 (!) 105/53  Pulse: (!) 58 (!) 56  Resp: 12 18  Temp: 36.5 C 36.4 C  SpO2: 98% 100%    Last Pain:  Vitals:   09/21/23 1408  TempSrc:   PainSc: 0-No pain                 Yvonna JINNY Bosworth

## 2023-09-24 ENCOUNTER — Ambulatory Visit (INDEPENDENT_AMBULATORY_CARE_PROVIDER_SITE_OTHER): Payer: Self-pay | Admitting: Gastroenterology

## 2023-09-28 NOTE — Progress Notes (Signed)
 3 yr TCS noted in recall Patient result letter mailed procedure note and pathology result faxed to PCP

## 2023-10-03 DIAGNOSIS — H353132 Nonexudative age-related macular degeneration, bilateral, intermediate dry stage: Secondary | ICD-10-CM | POA: Diagnosis not present

## 2023-10-03 DIAGNOSIS — H35371 Puckering of macula, right eye: Secondary | ICD-10-CM | POA: Diagnosis not present

## 2023-10-03 DIAGNOSIS — H43812 Vitreous degeneration, left eye: Secondary | ICD-10-CM | POA: Diagnosis not present

## 2023-10-03 DIAGNOSIS — H353221 Exudative age-related macular degeneration, left eye, with active choroidal neovascularization: Secondary | ICD-10-CM | POA: Diagnosis not present

## 2023-10-18 DIAGNOSIS — Z1283 Encounter for screening for malignant neoplasm of skin: Secondary | ICD-10-CM | POA: Diagnosis not present

## 2023-10-18 DIAGNOSIS — C44519 Basal cell carcinoma of skin of other part of trunk: Secondary | ICD-10-CM | POA: Diagnosis not present

## 2023-10-18 DIAGNOSIS — Z86006 Personal history of melanoma in-situ: Secondary | ICD-10-CM | POA: Diagnosis not present

## 2023-10-18 DIAGNOSIS — Z08 Encounter for follow-up examination after completed treatment for malignant neoplasm: Secondary | ICD-10-CM | POA: Diagnosis not present

## 2023-10-18 DIAGNOSIS — D225 Melanocytic nevi of trunk: Secondary | ICD-10-CM | POA: Diagnosis not present

## 2023-11-16 DIAGNOSIS — R55 Syncope and collapse: Secondary | ICD-10-CM | POA: Diagnosis not present

## 2023-11-16 DIAGNOSIS — W1830XA Fall on same level, unspecified, initial encounter: Secondary | ICD-10-CM | POA: Diagnosis not present

## 2023-11-22 DIAGNOSIS — Z08 Encounter for follow-up examination after completed treatment for malignant neoplasm: Secondary | ICD-10-CM | POA: Diagnosis not present

## 2023-11-22 DIAGNOSIS — Z85828 Personal history of other malignant neoplasm of skin: Secondary | ICD-10-CM | POA: Diagnosis not present

## 2023-11-22 DIAGNOSIS — Z85820 Personal history of malignant melanoma of skin: Secondary | ICD-10-CM | POA: Diagnosis not present

## 2023-12-12 ENCOUNTER — Encounter (INDEPENDENT_AMBULATORY_CARE_PROVIDER_SITE_OTHER): Payer: Self-pay | Admitting: Gastroenterology

## 2023-12-12 DIAGNOSIS — H353132 Nonexudative age-related macular degeneration, bilateral, intermediate dry stage: Secondary | ICD-10-CM | POA: Diagnosis not present

## 2023-12-12 DIAGNOSIS — H353221 Exudative age-related macular degeneration, left eye, with active choroidal neovascularization: Secondary | ICD-10-CM | POA: Diagnosis not present

## 2023-12-12 DIAGNOSIS — H35371 Puckering of macula, right eye: Secondary | ICD-10-CM | POA: Diagnosis not present

## 2023-12-12 DIAGNOSIS — H43812 Vitreous degeneration, left eye: Secondary | ICD-10-CM | POA: Diagnosis not present

## 2023-12-19 DIAGNOSIS — Z23 Encounter for immunization: Secondary | ICD-10-CM | POA: Diagnosis not present

## 2024-02-05 ENCOUNTER — Other Ambulatory Visit: Payer: Self-pay

## 2024-02-05 ENCOUNTER — Telehealth: Payer: Self-pay | Admitting: Urology

## 2024-02-05 DIAGNOSIS — N529 Male erectile dysfunction, unspecified: Secondary | ICD-10-CM

## 2024-02-05 MED ORDER — SILDENAFIL CITRATE 100 MG PO TABS: ORAL_TABLET | ORAL | 11 refills | Status: AC

## 2024-02-05 NOTE — Telephone Encounter (Signed)
 The patient called to request a medication refill of sildenafil  (VIAGRA ) 100 MG tablet.   Patient would like the medication sent to  [facility] [id number] [place], [place] - [address] Phone: [phone]  Fax: [phone]     pharmacy.  HOLD RX Cannot pick up medication until Saturday.

## 2024-02-05 NOTE — Telephone Encounter (Signed)
 erroe

## 2024-02-19 ENCOUNTER — Ambulatory Visit: Payer: Medicare Other | Admitting: Urology

## 2024-03-03 NOTE — Progress Notes (Signed)
 "   Impression/Assessment:  1.  Elevated PSA with negative biopsy in the past, not following anymore  2.  BPH, symptoms stable, nonbothersome, doing well  3.  Pyuria, relatively asymptomatic  Plan:  1.  No need to continue PSAs  2.  I will have him come back in 1 year to recheck.  Continue finasteride .  3.  I will have him come back in a couple of weeks to drop off a midstream urine-specimen today was initial urine output  History of Present Illness:   Here for f/u of BPH, elevated PSA     February, 1999- TRUS/Bx  --PSA 5.2. Path revealed  BPH and inflammatory change. Because of PSA elevation while on finasteride , he underwent repeat TRUS/Bx on 7.8.2013. Prostatic volume was 104 mL--all 12 cores were benign, 2 revealed acute and chronic inflammation.  1.6.2026: Steve Hawkins is here today for routine check.  His last visit was a year ago.  He has been doing well from urologic standpoint.  Still active, travels occasionally.  His wife does have physical limitations.  He entertains regularly.  Minimal lower urinary tract symptoms.    Past Medical History:  Diagnosis Date   Cancer (HCC) 12/2018   skin cancer-melanoma   Macular degeneration    Varicose veins     Past Surgical History:  Procedure Laterality Date   CHOLECYSTECTOMY     COLONOSCOPY N/A 04/23/2019   Procedure: COLONOSCOPY;  Surgeon: Golda Claudis PENNER, MD;  Location: [facility];  Service: Endoscopy;  Laterality: N/A;  930   COLONOSCOPY N/A 09/20/2023   Procedure: COLONOSCOPY;  Surgeon: Eartha Angelia Sieving, MD;  Location: [facility];  Service: Gastroenterology;  Laterality: N/A;  730am, asa 1-2   ENDOVENOUS ABLATION SAPHENOUS VEIN W/ LASER     ENDOVENOUS ABLATION SAPHENOUS VEIN W/ LASER Left 12-21-2014   endovenous laser ablation left small saphenous vein  by Dr. Lynwood Collum    ESOPHAGEAL DILATION N/A 11/01/2022   Procedure: ESOPHAGEAL DILATION;  Surgeon: Eartha Angelia Sieving, MD;  Location: [facility];   Service: Gastroenterology;  Laterality: N/A;   ESOPHAGOGASTRODUODENOSCOPY (EGD) WITH PROPOFOL  N/A 11/01/2022   Procedure: ESOPHAGOGASTRODUODENOSCOPY (EGD) WITH PROPOFOL ;  Surgeon: Eartha Angelia Sieving, MD;  Location: [facility];  Service: Gastroenterology;  Laterality: N/A;  8:00 am, asa 2   HEMORRHOID SURGERY     POLYPECTOMY  04/23/2019   Procedure: POLYPECTOMY;  Surgeon: Golda Claudis PENNER, MD;  Location: [facility];  Service: Endoscopy;;    Home Medications:  Allergies as of 03/04/2024       Reactions   Bee Venom Anaphylaxis        Medication List        Accurate as of March 03, 2024 11:54 AM. If you have any questions, ask your nurse or doctor.          finasteride  5 MG tablet Commonly known as: PROSCAR  TAKE 1 TABLET (5 MG TOTAL) BY MOUTH DAILY.   hydrocortisone  25 MG suppository Commonly known as: ANUSOL -HC Place 1 suppository (25 mg total) rectally every 12 (twelve) hours.   ICAPS AREDS 2 PO Take by mouth 2 (two) times daily.   sildenafil  100 MG tablet Commonly known as: VIAGRA  1/2 to 1 tablet p.o. as needed   tamsulosin  0.4 MG Caps capsule Commonly known as: FLOMAX  TAKE 1 CAPSULE BY MOUTH EVERY DAY        Allergies:  Allergies  Allergen Reactions   Bee Venom Anaphylaxis    Family History  Problem Relation  Age of Onset   Colon cancer Maternal Aunt     Social History:  reports that he has quit smoking. He has never used smokeless tobacco. He reports current alcohol use of about 7.0 standard drinks of alcohol per week. He reports that he does not use drugs.  ROS: A complete review of systems was performed.  All systems are negative except for pertinent findings as noted.  Physical Exam:  Vital signs in last 24 hours: There were no vitals taken for this visit. Constitutional:  Alert and oriented, No acute distress Cardiovascular: Regular rate  Respiratory: Normal respiratory effor Genitourinary: Normal anal sphincter tone.  Prostate is  50 g, symmetric, nonnodular, nontender Neurologic: Grossly intact, no focal deficits Psychiatric: Normal mood and affect  I have reviewed prior pt notes  I have reviewed urinalysis results-he did have a few red and white cells today  I have independently reviewed prior imaging-prostate ultrasound w/  volume  I have reviewed prior PSA as well as pathology results  Minimal residual urine volume based on scan today    "

## 2024-03-04 ENCOUNTER — Other Ambulatory Visit: Payer: Self-pay

## 2024-03-04 ENCOUNTER — Ambulatory Visit: Admitting: Urology

## 2024-03-04 VITALS — BP 137/71 | HR 64

## 2024-03-04 DIAGNOSIS — N401 Enlarged prostate with lower urinary tract symptoms: Secondary | ICD-10-CM

## 2024-03-04 DIAGNOSIS — R972 Elevated prostate specific antigen [PSA]: Secondary | ICD-10-CM | POA: Diagnosis not present

## 2024-03-04 DIAGNOSIS — R8281 Pyuria: Secondary | ICD-10-CM

## 2024-03-04 DIAGNOSIS — N529 Male erectile dysfunction, unspecified: Secondary | ICD-10-CM

## 2024-03-04 DIAGNOSIS — R339 Retention of urine, unspecified: Secondary | ICD-10-CM

## 2024-03-04 LAB — URINALYSIS, ROUTINE W REFLEX MICROSCOPIC
Bilirubin, UA: NEGATIVE
Glucose, UA: NEGATIVE
Nitrite, UA: NEGATIVE
Protein,UA: NEGATIVE
RBC, UA: NEGATIVE
Specific Gravity, UA: 1.02 (ref 1.005–1.030)
Urobilinogen, Ur: 4 mg/dL — ABNORMAL HIGH (ref 0.2–1.0)
pH, UA: 6 (ref 5.0–7.5)

## 2024-03-04 LAB — MICROSCOPIC EXAMINATION: WBC, UA: >30 /HPF — AB (ref 0–5)

## 2024-03-04 LAB — BLADDER SCAN AMB NON-IMAGING: Scan Result: 0

## 2024-03-04 NOTE — Progress Notes (Signed)
 Bladder Scan completed today due to reason of BPH  Patient can void prior to the bladder scan. Bladder scan result: 0  Performed By: Exie T. CMA  Additional notes- Patient is scheduled to follow up with MD

## 2024-03-04 NOTE — Addendum Note (Signed)
 Addended by: SAMMIE EXIE HERO on: 03/04/2024 04:24 PM   Modules accepted: Orders

## 2024-03-06 LAB — URINE CULTURE

## 2024-03-10 ENCOUNTER — Ambulatory Visit: Payer: Self-pay | Admitting: Urology

## 2024-03-18 ENCOUNTER — Other Ambulatory Visit

## 2024-03-18 DIAGNOSIS — R8281 Pyuria: Secondary | ICD-10-CM

## 2024-03-18 LAB — URINALYSIS, ROUTINE W REFLEX MICROSCOPIC
Bilirubin, UA: NEGATIVE
Glucose, UA: NEGATIVE
Nitrite, UA: NEGATIVE
Protein,UA: NEGATIVE
RBC, UA: NEGATIVE
Specific Gravity, UA: 1.025 (ref 1.005–1.030)
Urobilinogen, Ur: 2 mg/dL — ABNORMAL HIGH (ref 0.2–1.0)
pH, UA: 6 (ref 5.0–7.5)

## 2024-03-18 LAB — MICROSCOPIC EXAMINATION

## 2024-03-19 NOTE — Telephone Encounter (Signed)
 Pt made aware of MD. Dahlstedt response and voiced understanding.

## 2024-03-19 NOTE — Telephone Encounter (Signed)
-----   Message from Garnette Shack, MD sent at 03/18/2024  4:16 PM EST ----- Please call patient-his urine looked great.  No treatment needed
